# Patient Record
Sex: Female | Born: 1950 | Race: White | Hispanic: No | Smoking: Former smoker
Health system: Southern US, Community
[De-identification: ages and names within clinical notes are randomized; demographics above are authoritative.]

## PROBLEM LIST (undated history)

## (undated) DIAGNOSIS — J309 Allergic rhinitis, unspecified: Secondary | ICD-10-CM

## (undated) DIAGNOSIS — M4712 Other spondylosis with myelopathy, cervical region: Secondary | ICD-10-CM

## (undated) DIAGNOSIS — M5432 Sciatica, left side: Secondary | ICD-10-CM

## (undated) DIAGNOSIS — E785 Hyperlipidemia, unspecified: Secondary | ICD-10-CM

## (undated) DIAGNOSIS — F0390 Unspecified dementia without behavioral disturbance: Secondary | ICD-10-CM

## (undated) DIAGNOSIS — F419 Anxiety disorder, unspecified: Secondary | ICD-10-CM

## (undated) DIAGNOSIS — M21372 Foot drop, left foot: Secondary | ICD-10-CM

## (undated) DIAGNOSIS — B029 Zoster without complications: Secondary | ICD-10-CM

## (undated) DIAGNOSIS — M4800 Spinal stenosis, site unspecified: Secondary | ICD-10-CM

## (undated) DIAGNOSIS — G4733 Obstructive sleep apnea (adult) (pediatric): Secondary | ICD-10-CM

## (undated) DIAGNOSIS — M5030 Other cervical disc degeneration, unspecified cervical region: Secondary | ICD-10-CM

## (undated) DIAGNOSIS — K219 Gastro-esophageal reflux disease without esophagitis: Secondary | ICD-10-CM

## (undated) DIAGNOSIS — R7303 Prediabetes: Secondary | ICD-10-CM

## (undated) DIAGNOSIS — M810 Age-related osteoporosis without current pathological fracture: Secondary | ICD-10-CM

## (undated) DIAGNOSIS — F32A Depression, unspecified: Secondary | ICD-10-CM

## (undated) DIAGNOSIS — I10 Essential (primary) hypertension: Secondary | ICD-10-CM

## (undated) HISTORY — DX: Spinal stenosis, site unspecified: M48.00

## (undated) HISTORY — DX: Obstructive sleep apnea (adult) (pediatric): G47.33

## (undated) HISTORY — DX: Anxiety disorder, unspecified: F41.9

## (undated) HISTORY — DX: Sciatica, left side: M54.32

## (undated) HISTORY — DX: Other cervical disc degeneration, unspecified cervical region: M50.30

## (undated) HISTORY — PX: CARDIAC CATHETERIZATION: SHX172

## (undated) HISTORY — DX: Allergic rhinitis, unspecified: J30.9

## (undated) HISTORY — PX: PARTIAL HYSTERECTOMY: SHX80

## (undated) HISTORY — DX: Gastro-esophageal reflux disease without esophagitis: K21.9

## (undated) HISTORY — PX: OTHER SURGICAL HISTORY: SHX169

## (undated) HISTORY — PX: BREAST BIOPSY: SHX20

## (undated) HISTORY — PX: VEIN LIGATION AND STRIPPING: SHX2653

---

## 2015-10-30 ENCOUNTER — Encounter: Payer: Self-pay | Admitting: Internal Medicine

## 2015-10-30 ENCOUNTER — Ambulatory Visit (INDEPENDENT_AMBULATORY_CARE_PROVIDER_SITE_OTHER): Payer: Federal, State, Local not specified - PPO | Admitting: Internal Medicine

## 2015-10-30 VITALS — BP 122/76 | HR 84 | Ht 65.0 in | Wt 140.6 lb

## 2015-10-30 DIAGNOSIS — K219 Gastro-esophageal reflux disease without esophagitis: Secondary | ICD-10-CM | POA: Insufficient documentation

## 2015-10-30 DIAGNOSIS — B029 Zoster without complications: Secondary | ICD-10-CM

## 2015-10-30 DIAGNOSIS — F419 Anxiety disorder, unspecified: Secondary | ICD-10-CM

## 2015-10-30 DIAGNOSIS — M5417 Radiculopathy, lumbosacral region: Secondary | ICD-10-CM

## 2015-10-30 DIAGNOSIS — M5412 Radiculopathy, cervical region: Secondary | ICD-10-CM | POA: Diagnosis not present

## 2015-10-30 DIAGNOSIS — M5416 Radiculopathy, lumbar region: Secondary | ICD-10-CM

## 2015-10-30 DIAGNOSIS — F32A Depression, unspecified: Secondary | ICD-10-CM | POA: Insufficient documentation

## 2015-10-30 MED ORDER — ESCITALOPRAM OXALATE 5 MG PO TABS: 15.0000 mg | ORAL_TABLET | Freq: Every day | ORAL | Status: DC

## 2015-10-30 MED ORDER — ACYCLOVIR 400 MG PO TABS: 400.0000 mg | ORAL_TABLET | Freq: Two times a day (BID) | ORAL | Status: DC

## 2015-10-30 MED ORDER — OMEPRAZOLE 20 MG PO CPDR: 20.0000 mg | DELAYED_RELEASE_CAPSULE | Freq: Every day | ORAL | Status: DC

## 2015-10-30 NOTE — Progress Notes (Signed)
Date:  10/30/2015   Name:  Sarah Donovan   DOB:  02-11-1951   MRN:  [id number]   Chief Complaint: New Evaluation Patient recently moved here from [place], [place] to be closer to family in [place] - started about 6 years ago when husband passed away.  Started on Lexpro and recently increased dose to 15 mg per day. She thinks it is helping.  GERD - on PPI for years. EGD done 4 years ago showed some inflammation.  She has good control of symptoms.  Shingles - started in 2015 on right buttock.  She has had 3 breakouts since then.  She now takes daily suppresson.  She has also a hx of fever blisters. She does not know if the blisters were ever cultured to determine if it is Varicella or Herpes.  She is under the impression that the Zostavax caused her to have this.  Lumbar Disc disease - found on MRI - mild and not causing symptoms.  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  She has tried PTx and ESI injections without benefit.  Neurosurgeon does not believe that she would benefit from surgery.  She has ongoing right sided sciatica and right arm tingling and intermittent antecubital pain.  Her last CPX was in December.  She had a normal mammogram and Pelvic/pap at that time.  She had a colonoscopy about 10 yrs ago.  It was normal but very painful.  She would consider a 10 yr follow up.  Review of Systems  Constitutional: Negative for fever, chills, appetite change and fatigue.  Respiratory: Negative for cough, chest tightness, shortness of breath and wheezing.   Cardiovascular: Negative for chest pain, palpitations and leg swelling.  Gastrointestinal: Negative for abdominal pain and blood in stool.  Genitourinary: Positive for dyspareunia (improved with osphena). Negative for hematuria and pelvic pain.  Musculoskeletal: Positive for back pain (and right sided sciatica), joint swelling (intermittent in hands) and neck stiffness. Negative for gait problem.  Skin: Negative for rash and  wound.  Neurological: Positive for numbness. Negative for dizziness, tremors, weakness, light-headedness and headaches.  Hematological: Negative for adenopathy. Does not bruise/bleed easily.  Psychiatric/Behavioral: Negative for hallucinations, confusion and sleep disturbance. The patient is nervous/anxious.     There are no active problems to display for this patient.   Prior to Admission medications   Medication Sig Start Date End Date Taking? Authorizing Provider  acyclovir (ZOVIRAX) 400 MG tablet Take 1 tablet by mouth 2 (two) times daily. 09/19/15  Yes Historical Provider, MD  escitalopram (LEXAPRO) 5 MG tablet Take 1 tablet by mouth daily. 09/23/15  Yes Historical Provider, MD  fluticasone Asencion Islam) 50 MCG/ACT nasal spray  08/14/15  Yes Historical Provider, MD  omeprazole (PRILOSEC) 20 MG capsule Take 20 mg by mouth daily. 07/24/15  Yes Historical Provider, MD    Allergies  Allergen Reactions  . Penicillins     Past Surgical History  Procedure Laterality Date  . Partial hysterectomy      ovaries removed  . Vein ligation and stripping Left     left leg  . Breast biopsy      Social History  Substance Use Topics  . Smoking status: Former Smoker    Quit date: 10/30/1995  . Smokeless tobacco: None  . Alcohol Use: 0.0 oz/week    0 Standard drinks or equivalent per week     Comment: couple glasses of wine/ 4 beers every few days    Medication list has been  reviewed and updated.  Physical Exam  Constitutional: She is oriented to person, place, and time. She appears well-developed. No distress.  HENT:  Head: Normocephalic and atraumatic.  Neck: Muscular tenderness present. Carotid bruit is not present. No thyroid mass present.  Cardiovascular: Normal rate, regular rhythm and normal heart sounds.   Pulmonary/Chest: Effort normal and breath sounds normal. No respiratory distress. She has no rales.  Musculoskeletal: Normal range of motion.       Right elbow: She exhibits  normal range of motion and no swelling. Tenderness found.       Lumbar back: She exhibits normal range of motion, no tenderness, no bony tenderness and no spasm.  Lymphadenopathy:    She has no cervical adenopathy.  Neurological: She is alert and oriented to person, place, and time. She has normal reflexes.  Skin: Skin is warm and dry. No rash noted.  Psychiatric: She has a normal mood and affect. Her behavior is normal. Thought content normal.  Nursing note and vitals reviewed.   BP 122/76 mmHg  Pulse 84  Ht 5\' 5"  (1.651 m)  Wt 140 lb 9.6 oz (63.776 kg)  BMI 23.40 kg/m2  Assessment and Plan: 1. Anxiety disorder, unspecified Improved - continue current therapy - escitalopram (LEXAPRO) 5 MG tablet; Take 3 tablets (15 mg total) by mouth daily.  Dispense: 90 tablet; Refill: 5  2. GERD without esophagitis controlled - omeprazole (PRILOSEC) 20 MG capsule; Take 1 capsule (20 mg total) by mouth daily.  Dispense: 30 capsule; Refill: 5  3. Herpes zoster Could also be cutaneous herpes simplex - she will try to find out if her previous PCP did a culture Continue daily suppressive therapy - acyclovir (ZOVIRAX) 400 MG tablet; Take 1 tablet (400 mg total) by mouth 2 (two) times daily.  Dispense: 60 tablet; Refill: 5  4. Chronic cervical radiculopathy Unexplained right antecubital discomfort - likely neuropathic Pt will get MRI reports to review for consideration of further evaluation  5. Lumbar back pain with radiculopathy affecting right lower extremity Pt will get me a copy of MRI to review   Halina Maidens, MD [facility]  10/30/2015

## 2015-12-09 ENCOUNTER — Ambulatory Visit (INDEPENDENT_AMBULATORY_CARE_PROVIDER_SITE_OTHER): Payer: Federal, State, Local not specified - PPO | Admitting: Internal Medicine

## 2015-12-09 ENCOUNTER — Encounter: Payer: Self-pay | Admitting: Internal Medicine

## 2015-12-09 ENCOUNTER — Ambulatory Visit
Admission: RE | Admit: 2015-12-09 | Discharge: 2015-12-09 | Disposition: A | Discharge status: Home / Self Care | Payer: Federal, State, Local not specified - PPO | Source: Ambulatory Visit | Attending: Internal Medicine | Admitting: Internal Medicine

## 2015-12-09 VITALS — BP 132/80 | HR 84 | Temp 98.0°F | Resp 16 | Ht 65.0 in | Wt 139.0 lb

## 2015-12-09 DIAGNOSIS — M25552 Pain in left hip: Secondary | ICD-10-CM

## 2015-12-09 MED ORDER — METHYLPREDNISOLONE 4 MG PO TBPK: ORAL_TABLET | ORAL | Status: DC

## 2015-12-09 NOTE — Progress Notes (Signed)
Date:  12/09/2015   Name:  Sarah Donovan   DOB:  Apr 15, 1951   MRN:  [id number]   Chief Complaint: Hip Pain Hip Pain  There was no injury mechanism. The pain is present in the left hip. The quality of the pain is described as aching. The pain is moderate. Associated symptoms include an inability to bear weight. She has tried heat, ice and NSAIDs for the symptoms. The treatment provided mild relief.  Pain is above the anterior left hip and lateral pubic rami. When sitting there is very little pain.  She can not lay on that side.  Getting into and out of the car is very painful. She has a hx of sciatica on the right - this is not the same type of discomfort.  There is no radiation of pain into the buttocks or down the leg.   Review of Systems  Constitutional: Negative for fever, chills and fatigue.  Respiratory: Negative for chest tightness and shortness of breath.   Cardiovascular: Negative for chest pain, palpitations and leg swelling.  Gastrointestinal: Negative for nausea, abdominal pain and constipation.  Genitourinary: Negative for difficulty urinating.  Musculoskeletal: Positive for myalgias, arthralgias and gait problem. Negative for joint swelling.    Patient Active Problem List   Diagnosis Date Noted  . Anxiety disorder, unspecified 10/30/2015  . GERD without esophagitis 10/30/2015  . Herpes zoster 10/30/2015  . Chronic cervical radiculopathy 10/30/2015  . Lumbar back pain with radiculopathy affecting right lower extremity 10/30/2015    Prior to Admission medications   Medication Sig Start Date End Date Taking? Authorizing Provider  acyclovir (ZOVIRAX) 400 MG tablet Take 1 tablet (400 mg total) by mouth 2 (two) times daily. 10/30/15  Yes Glean Hess, MD  escitalopram (LEXAPRO) 5 MG tablet Take 3 tablets (15 mg total) by mouth daily. 10/30/15  Yes Glean Hess, MD  fluticasone Asencion Islam) 50 MCG/ACT nasal spray  08/14/15  Yes Historical Provider, MD  omeprazole (PRILOSEC)  20 MG capsule Take 1 capsule (20 mg total) by mouth daily. 10/30/15  Yes Glean Hess, MD  Ospemifene (OSPHENA) 60 MG TABS Take 1 tablet by mouth daily.   Yes Historical Provider, MD    Allergies  Allergen Reactions  . Gabapentin Shortness Of Breath  . Penicillins     Past Surgical History  Procedure Laterality Date  . Partial hysterectomy      ovaries removed  . Vein ligation and stripping Left     left leg  . Breast biopsy      Social History  Substance Use Topics  . Smoking status: Former Smoker    Quit date: 10/30/1995  . Smokeless tobacco: None  . Alcohol Use: 0.0 oz/week    0 Standard drinks or equivalent per week     Comment: couple glasses of wine/ 4 beers every few days    Medication list has been reviewed and updated.  Physical Exam  Constitutional: She is oriented to person, place, and time. She appears well-developed and well-nourished. She appears distressed (moderately uncomfortable).  Neck: Normal range of motion. Neck supple.  Cardiovascular: Normal rate, regular rhythm and normal heart sounds.   Pulmonary/Chest: Effort normal and breath sounds normal.  Abdominal: Soft. Normal appearance and bowel sounds are normal.  Musculoskeletal:       Right hip: She exhibits normal range of motion, no tenderness and no bony tenderness.       Left hip: She exhibits normal range of motion and no bony  tenderness.       Lumbar back: She exhibits no tenderness, no swelling and no spasm.  Tender of the anterior iliac crest above the hip. No tenderness of lateral hip bursa No pain with ROM of left hip   Neurological: She is alert and oriented to person, place, and time.  Reflex Scores:      Patellar reflexes are 2+ on the right side and 2+ on the left side. Nursing note and vitals reviewed.   BP 132/80 mmHg  Pulse 84  Temp(Src) 98 F (36.7 C) (Oral)  Resp 16  Ht 5\' 5"  (1.651 m)  Wt 139 lb (63.05 kg)  BMI 23.13 kg/m2  SpO2 96%  Assessment and Plan: 1. Hip  pain, acute, left Suspect muscle-ligament strain If xray abnormal and/or pain persistent, will refer to Orthopedics - methylPREDNISolone (MEDROL DOSEPAK) 4 MG TBPK tablet; Take 6 pills on day 1 the 5 pills day 2 then 4 pills day 3 then 3 pills day 4 then 2 pills day 5 then one pills day 6 then stop  Dispense: 21 tablet; Refill: 0 - DG HIP UNILAT WITH PELVIS 2-3 VIEWS LEFT; Future   Halina Maidens, MD [facility]  12/09/2015

## 2016-01-30 ENCOUNTER — Ambulatory Visit: Payer: Federal, State, Local not specified - PPO | Admitting: Internal Medicine

## 2016-03-03 ENCOUNTER — Ambulatory Visit (INDEPENDENT_AMBULATORY_CARE_PROVIDER_SITE_OTHER): Payer: Federal, State, Local not specified - PPO | Admitting: Family Medicine

## 2016-03-03 ENCOUNTER — Encounter: Payer: Self-pay | Admitting: Family Medicine

## 2016-03-03 VITALS — BP 120/84 | HR 60 | Ht 65.0 in | Wt 135.0 lb

## 2016-03-03 DIAGNOSIS — N3001 Acute cystitis with hematuria: Secondary | ICD-10-CM

## 2016-03-03 LAB — POCT URINALYSIS DIPSTICK
Bilirubin, UA: NEGATIVE
Glucose, UA: NEGATIVE
Ketones, UA: NEGATIVE
Nitrite, UA: NEGATIVE
Protein, UA: NEGATIVE
Spec Grav, UA: 1.01
Urobilinogen, UA: 0.2
pH, UA: 6

## 2016-03-03 MED ORDER — AZITHROMYCIN 250 MG PO TABS: ORAL_TABLET | ORAL | 0 refills | Status: DC

## 2016-03-03 MED ORDER — CIPROFLOXACIN HCL 250 MG PO TABS: 250.0000 mg | ORAL_TABLET | Freq: Two times a day (BID) | ORAL | 0 refills | Status: DC

## 2016-03-03 NOTE — Progress Notes (Signed)
Name: Sarah Donovan   MRN: [id number]    DOB: [birth year]   Date:03/03/2016       Progress Note  Subjective  Chief Complaint  Chief Complaint  Patient presents with  . Urinary Tract Infection    pressure at end of stream- hx of UTIs- [employer] works for her    Urinary Tract Infection   This is a new problem. The current episode started in the past 7 days. The problem occurs every urination. The problem has been gradually worsening. The quality of the pain is described as burning. The pain is mild. There has been no fever. Associated symptoms include frequency and urgency. Pertinent negatives include no chills, discharge, flank pain, hematuria, sweats or vomiting. She has tried increased fluids and home medications for the symptoms. The treatment provided no relief. Her past medical history is significant for kidney stones. There is no history of recurrent UTIs.    No problem-specific Assessment & Plan notes found for this encounter.   Past Medical History:  Diagnosis Date  . Allergic rhinitis   . Anxiety   . Degenerative cervical disc   . GERD (gastroesophageal reflux disease)   . Obstructive sleep apnea    Currently on CPAP  . Spinal stenosis     Past Surgical History:  Procedure Laterality Date  . BREAST BIOPSY    . PARTIAL HYSTERECTOMY     ovaries removed  . VEIN LIGATION AND STRIPPING Left    left leg    Family History  Problem Relation Age of Onset  . Lung cancer Mother   . Heart failure Father     Social History   Social History  . Marital status: Unknown    Spouse name: N/A  . Number of children: N/A  . Years of education: N/A   Occupational History  . Not on file.   Social History Main Topics  . Smoking status: Former Smoker    Quit date: 10/30/1995  . Smokeless tobacco: Never Used  . Alcohol use 0.0 oz/week     Comment: couple glasses of wine/ 4 beers every few days  . Drug use: No  . Sexual activity: Not Currently   Other Topics Concern  . Not on file    Social History Narrative  . No narrative on file    Allergies  Allergen Reactions  . Gabapentin Shortness Of Breath  . Penicillins      Review of Systems  Constitutional: Negative for chills.  Gastrointestinal: Negative for vomiting.  Genitourinary: Positive for frequency and urgency. Negative for flank pain and hematuria.     Objective  Vitals:   03/03/16 0947  BP: 120/84  Pulse: 60  Weight: 135 lb (61.2 kg)  Height: 5\' 5"  (1.651 m)    Physical Exam  Constitutional: She is well-developed, well-nourished, and in no distress. No distress.  HENT:  Head: Normocephalic and atraumatic.  Right Ear: External ear normal.  Left Ear: External ear normal.  Nose: Nose normal.  Mouth/Throat: Oropharynx is clear and moist.  Eyes: Conjunctivae and EOM are normal. Pupils are equal, round, and reactive to light. Right eye exhibits no discharge. Left eye exhibits no discharge.  Neck: Normal range of motion. Neck supple. No JVD present. No thyromegaly present.  Cardiovascular: Normal rate, regular rhythm, normal heart sounds and intact distal pulses.  Exam reveals no gallop and no friction rub.   No murmur heard. Pulmonary/Chest: Effort normal and breath sounds normal.  Abdominal: Soft. Bowel sounds are normal. She exhibits no  mass. There is no hepatosplenomegaly. There is tenderness in the suprapubic area. There is no guarding and no CVA tenderness.  Musculoskeletal: Normal range of motion. She exhibits no edema.  Lymphadenopathy:    She has no cervical adenopathy.  Neurological: She is alert. She has normal reflexes.  Skin: Skin is warm and dry. She is not diaphoretic.  Psychiatric: Mood and affect normal.  Nursing note and vitals reviewed.     Assessment & Plan  Problem List Items Addressed This Visit    None    Visit Diagnoses    Acute cystitis with hematuria    -  Primary   Relevant Medications   ciprofloxacin (CIPRO) 250 MG tablet   Other Relevant Orders   POCT  Urinalysis Dipstick (Completed)        Dr. [facility]  03/03/16

## 2016-03-16 ENCOUNTER — Ambulatory Visit (INDEPENDENT_AMBULATORY_CARE_PROVIDER_SITE_OTHER): Payer: Federal, State, Local not specified - PPO | Admitting: Internal Medicine

## 2016-03-16 ENCOUNTER — Encounter: Payer: Self-pay | Admitting: Internal Medicine

## 2016-03-16 VITALS — BP 121/80 | HR 59 | Resp 16 | Ht 65.0 in | Wt 135.6 lb

## 2016-03-16 DIAGNOSIS — M5412 Radiculopathy, cervical region: Secondary | ICD-10-CM | POA: Diagnosis not present

## 2016-03-16 DIAGNOSIS — M6530 Trigger finger, unspecified finger: Secondary | ICD-10-CM | POA: Diagnosis not present

## 2016-03-16 DIAGNOSIS — E785 Hyperlipidemia, unspecified: Secondary | ICD-10-CM | POA: Diagnosis not present

## 2016-03-16 DIAGNOSIS — E782 Mixed hyperlipidemia: Secondary | ICD-10-CM | POA: Insufficient documentation

## 2016-03-16 DIAGNOSIS — G4730 Sleep apnea, unspecified: Secondary | ICD-10-CM | POA: Insufficient documentation

## 2016-03-16 DIAGNOSIS — G4733 Obstructive sleep apnea (adult) (pediatric): Secondary | ICD-10-CM | POA: Diagnosis not present

## 2016-03-16 DIAGNOSIS — Z9989 Dependence on other enabling machines and devices: Secondary | ICD-10-CM

## 2016-03-16 NOTE — Patient Instructions (Signed)
Health Maintenance  Topic Date Due  . Hepatitis C Screening  10/08/1950  . HIV Screening  12/25/1965  . TETANUS/TDAP  12/25/1969  . DEXA SCAN  12/26/2015  . PNA vac Low Risk Adult (1 of 2 - PCV13) 12/26/2015  . COLONOSCOPY  01/16/2016  . INFLUENZA VACCINE  03/03/2016  . MAMMOGRAM  09/01/2016  . PAP SMEAR  07/17/2018  . ZOSTAVAX  Addressed

## 2016-03-16 NOTE — Progress Notes (Signed)
Date:  03/16/2016   Name:  Sarah Donovan   DOB:  07/30/1951   MRN:  [id number]   Chief Complaint: Hyperlipidemia and Hand Pain (Right hand feels tight and like a baloon  and when making fist the 3 digit does not open back up. ) OSA -  On CPAP nightly, sleeps fairly well.  Very consistent in using the device.  She is able to download data to her phone for monitoring.   Hyperlipidemia  This is a chronic problem. The current episode started more than 1 year ago. Recent lipid tests were reviewed and are high. Pertinent negatives include no chest pain or shortness of breath. Treatments tried: previously took medications. The current treatment provides significant improvement of lipids.  Hand Pain   There was no injury mechanism. The pain does not radiate. The pain is mild. Pertinent negatives include no chest pain. Associated symptoms comments: Triggering of middle finger for the first time this AM. She has tried NSAIDs for the symptoms. The treatment provided moderate relief.  Neck Pain   This is a chronic problem. The problem occurs daily. The problem has been unchanged. The quality of the pain is described as aching and burning. Pertinent negatives include no chest pain.    Review of Systems  Constitutional: Negative for activity change, appetite change and unexpected weight change.  Eyes: Negative for visual disturbance.  Respiratory: Negative for cough, chest tightness, shortness of breath and wheezing.   Cardiovascular: Negative for chest pain, palpitations and leg swelling.  Gastrointestinal: Negative for abdominal pain.  Musculoskeletal: Positive for arthralgias, joint swelling and neck pain.  Skin: Negative for color change and rash.  Hematological: Negative for adenopathy.  Psychiatric/Behavioral: Positive for sleep disturbance. Negative for dysphoric mood.    Patient Active Problem List   Diagnosis Date Noted  . Hyperlipidemia 03/16/2016  . Trigger finger, acquired 03/16/2016    . Anxiety disorder, unspecified 10/30/2015  . GERD without esophagitis 10/30/2015  . Herpes zoster 10/30/2015  . Chronic cervical radiculopathy 10/30/2015  . Lumbar back pain with radiculopathy affecting right lower extremity 10/30/2015    Prior to Admission medications   Medication Sig Start Date End Date Taking? Authorizing Provider  acyclovir (ZOVIRAX) 400 MG tablet Take 1 tablet (400 mg total) by mouth 2 (two) times daily. 10/30/15  Yes Glean Hess, MD  escitalopram (LEXAPRO) 5 MG tablet Take 3 tablets (15 mg total) by mouth daily. 10/30/15  Yes Glean Hess, MD  omeprazole (PRILOSEC) 20 MG capsule Take 1 capsule (20 mg total) by mouth daily. 10/30/15  Yes Glean Hess, MD  Ospemifene (OSPHENA) 60 MG TABS Take 1 tablet by mouth daily.   Yes Historical Provider, MD    Allergies  Allergen Reactions  . Gabapentin Shortness Of Breath  . Penicillins     Past Surgical History:  Procedure Laterality Date  . BREAST BIOPSY    . PARTIAL HYSTERECTOMY     ovaries removed  . VEIN LIGATION AND STRIPPING Left    left leg    Social History  Substance Use Topics  . Smoking status: Former Smoker    Quit date: 10/30/1995  . Smokeless tobacco: Never Used  . Alcohol use 0.0 oz/week     Comment: couple glasses of wine/ 4 beers every few days     Medication list has been reviewed and updated.   Physical Exam  Constitutional: She is oriented to person, place, and time. She appears well-developed. No distress.  HENT:  Head:  Normocephalic and atraumatic.  Cardiovascular: Normal rate and regular rhythm.   Pulmonary/Chest: Effort normal and breath sounds normal. No respiratory distress.  Musculoskeletal: Normal range of motion.  Tenderness of MCP joints both hands without synovitis Grip decreased due to discomfort No joint deformity noted  Neurological: She is alert and oriented to person, place, and time. She has normal reflexes.  Skin: Skin is warm and dry. No rash noted.   Psychiatric: She has a normal mood and affect. Her behavior is normal. Thought content normal.  Nursing note and vitals reviewed.   BP 121/80 (BP Location: Left Arm, Patient Position: Sitting, Cuff Size: Normal)   Pulse (!) 59   Resp 16   Ht 5\' 5"  (1.651 m)   Wt 135 lb 9.6 oz (61.5 kg)   SpO2 97%   BMI 22.57 kg/m   Assessment and Plan: 1. Hyperlipidemia Will advise on medication if needed - Comprehensive metabolic panel - Lipid panel  2. Trigger finger, acquired Continue Aleve for hand discomfort and neck pain - TSH - Rheumatoid factor  3. OSA on CPAP Doing well  4. Chronic cervical radiculopathy Stable; continue Aleve bid   Halina Maidens, MD [facility]  03/16/2016

## 2016-03-17 LAB — COMPREHENSIVE METABOLIC PANEL
ALBUMIN: 4 g/dL (ref 3.6–4.8)
ALK PHOS: 59 IU/L (ref 39–117)
ALT: 18 IU/L (ref 0–32)
AST: 24 IU/L (ref 0–40)
Albumin/Globulin Ratio: 1.3 (ref 1.2–2.2)
BILIRUBIN TOTAL: 0.5 mg/dL (ref 0.0–1.2)
BUN/Creatinine Ratio: 18 (ref 12–28)
BUN: 15 mg/dL (ref 8–27)
CHLORIDE: 102 mmol/L (ref 96–106)
CO2: 24 mmol/L (ref 18–29)
CREATININE: 0.83 mg/dL (ref 0.57–1.00)
Calcium: 9 mg/dL (ref 8.7–10.3)
GFR calc non Af Amer: 74 mL/min/{1.73_m2} (ref >59)
GFR, EST AFRICAN AMERICAN: 86 mL/min/{1.73_m2} (ref >59)
GLOBULIN, TOTAL: 3 g/dL (ref 1.5–4.5)
Glucose: 80 mg/dL (ref 65–99)
Potassium: 4.5 mmol/L (ref 3.5–5.2)
Sodium: 139 mmol/L (ref 134–144)
TOTAL PROTEIN: 7 g/dL (ref 6.0–8.5)

## 2016-03-17 LAB — LIPID PANEL
Chol/HDL Ratio: 3.6 ratio units (ref 0.0–4.4)
Cholesterol, Total: 265 mg/dL — ABNORMAL HIGH (ref 100–199)
HDL: 73 mg/dL (ref >39)
LDL CALC: 168 mg/dL — AB (ref 0–99)
Triglycerides: 119 mg/dL (ref 0–149)
VLDL CHOLESTEROL CAL: 24 mg/dL (ref 5–40)

## 2016-03-17 LAB — TSH: TSH: 3.35 u[IU]/mL (ref 0.450–4.500)

## 2016-03-17 LAB — RHEUMATOID FACTOR: Rhuematoid fact SerPl-aCnc: 14.6 IU/mL — ABNORMAL HIGH (ref 0.0–13.9)

## 2016-03-19 LAB — CYCLIC CITRUL PEPTIDE ANTIBODY, IGG/IGA: CYCLIC CITRULLIN PEPTIDE AB: 4 U (ref 0–19)

## 2016-03-19 LAB — SPECIMEN STATUS REPORT

## 2016-04-07 ENCOUNTER — Ambulatory Visit (INDEPENDENT_AMBULATORY_CARE_PROVIDER_SITE_OTHER): Payer: Federal, State, Local not specified - PPO | Admitting: Internal Medicine

## 2016-04-07 ENCOUNTER — Encounter: Payer: Self-pay | Admitting: Internal Medicine

## 2016-04-07 VITALS — BP 124/82 | HR 61 | Resp 16 | Ht 65.0 in | Wt 133.0 lb

## 2016-04-07 DIAGNOSIS — M5417 Radiculopathy, lumbosacral region: Secondary | ICD-10-CM

## 2016-04-07 DIAGNOSIS — M5416 Radiculopathy, lumbar region: Secondary | ICD-10-CM

## 2016-04-07 MED ORDER — CYCLOBENZAPRINE HCL 10 MG PO TABS: 10.0000 mg | ORAL_TABLET | Freq: Every day | ORAL | 0 refills | Status: DC

## 2016-04-07 MED ORDER — METHYLPREDNISOLONE 4 MG PO TBPK: ORAL_TABLET | ORAL | 0 refills | Status: DC

## 2016-04-07 NOTE — Progress Notes (Signed)
Date:  04/07/2016   Name:  Sarah Donovan   DOB:  [birth year]   MRN:  [id number]   Chief Complaint: Hip Pain (Right Hip pain started 12 noon unday Denies present or past injury. Feels Bone or tenson pop. Patient walked to room in hunch like position. She can not stand upright. She has pain from bottom of foot radiating to hip at times. Sitting is only time that happens. Standing causes pain ONLY in hip. ) Patient has a history of piriformis muscle syndrome as well as lumbar back pain with radiculopathy. This pain started suddenly in her posterior right hip making it very painful her to stand straight and bear weight. Sitting is more comfortable. She can lie down if her knees are bent. She's been taking Aleve twice a day without much benefit. She's never established with orthopedics in this area. Several months ago she had similar pain in the left hip. X-rays of the hip were normal. She responded well to prednisone taper.   Review of Systems  Constitutional: Negative for chills, fatigue and fever.  Respiratory: Negative for chest tightness and shortness of breath.   Cardiovascular: Negative for chest pain.  Gastrointestinal: Negative for abdominal pain.  Musculoskeletal: Positive for arthralgias, back pain and gait problem. Negative for joint swelling and myalgias.  Neurological: Positive for numbness (radiating hip to toes). Negative for dizziness, weakness and headaches.    Patient Active Problem List   Diagnosis Date Noted  . Hyperlipidemia 03/16/2016  . Trigger finger, acquired 03/16/2016  . OSA on CPAP 03/16/2016  . Anxiety disorder, unspecified 10/30/2015  . GERD without esophagitis 10/30/2015  . Herpes zoster 10/30/2015  . Chronic cervical radiculopathy 10/30/2015  . Lumbar back pain with radiculopathy affecting right lower extremity 10/30/2015    Prior to Admission medications   Medication Sig Start Date End Date Taking? Authorizing Provider  acyclovir (ZOVIRAX) 400 MG tablet  Take 1 tablet (400 mg total) by mouth 2 (two) times daily. 10/30/15  Yes Glean Hess, MD  escitalopram (LEXAPRO) 5 MG tablet Take 3 tablets (15 mg total) by mouth daily. 10/30/15  Yes Glean Hess, MD  naproxen sodium (ANAPROX) 220 MG tablet Take 220 mg by mouth 2 (two) times daily with a meal.   Yes Historical Provider, MD  omeprazole (PRILOSEC) 20 MG capsule Take 1 capsule (20 mg total) by mouth daily. 10/30/15  Yes Glean Hess, MD  Ospemifene (OSPHENA) 60 MG TABS Take 1 tablet by mouth daily.   Yes Historical Provider, MD    Allergies  Allergen Reactions  . Gabapentin Shortness Of Breath  . Penicillins     Past Surgical History:  Procedure Laterality Date  . BREAST BIOPSY    . PARTIAL HYSTERECTOMY     ovaries removed  . VEIN LIGATION AND STRIPPING Left    left leg    Social History  Substance Use Topics  . Smoking status: Former Smoker    Quit date: 10/30/1995  . Smokeless tobacco: Never Used  . Alcohol use 0.0 oz/week     Comment: couple glasses of wine/ 4 beers every few days     Medication list has been reviewed and updated.   Physical Exam  Constitutional: She is oriented to person, place, and time. She appears well-developed. She appears distressed (secondary to pain).  Cardiovascular: Normal rate, regular rhythm and normal heart sounds.   Pulmonary/Chest: Effort normal and breath sounds normal.  Musculoskeletal:       Right hip: She exhibits  tenderness. She exhibits normal range of motion.       Left hip: Normal.       Lumbar back: She exhibits decreased range of motion and tenderness. She exhibits no spasm.  Neurological: She is alert and oriented to person, place, and time.  Skin: Skin is warm.  Psychiatric: She has a normal mood and affect.    BP 124/82 (BP Location: Left Arm, Patient Position: Sitting, Cuff Size: Normal)   Pulse 61   Resp 16   Ht 5\' 5"  (1.651 m)   Wt 133 lb (60.3 kg)   SpO2 98%   BMI 22.13 kg/m   Assessment and Plan: 1.  Lumbar back pain with radiculopathy affecting right lower extremity Suspect recurrence rather than primary hip pathology - methylPREDNISolone (MEDROL DOSEPAK) 4 MG TBPK tablet; Take 6 pills on day 1 the 5 pills day 2 then 4 pills day 3 then 3 pills day 4 then 2 pills day 5 then one pills day 6 then stop  Dispense: 21 tablet; Refill: 0 - cyclobenzaprine (FLEXERIL) 10 MG tablet; Take 1 tablet (10 mg total) by mouth at bedtime.  Dispense: 30 tablet; Refill: 0 - Ambulatory referral to [facility], [facility]  04/07/2016

## 2016-04-08 ENCOUNTER — Other Ambulatory Visit: Payer: Self-pay | Admitting: Internal Medicine

## 2016-04-08 MED ORDER — TRAMADOL HCL 50 MG PO TABS: 50.0000 mg | ORAL_TABLET | Freq: Four times a day (QID) | ORAL | 0 refills | Status: DC | PRN

## 2016-04-27 ENCOUNTER — Encounter: Payer: Self-pay | Admitting: Internal Medicine

## 2016-05-01 ENCOUNTER — Other Ambulatory Visit: Payer: Self-pay | Admitting: Internal Medicine

## 2016-05-01 DIAGNOSIS — K219 Gastro-esophageal reflux disease without esophagitis: Secondary | ICD-10-CM

## 2016-05-27 ENCOUNTER — Other Ambulatory Visit: Payer: Self-pay | Admitting: Internal Medicine

## 2016-05-27 DIAGNOSIS — K219 Gastro-esophageal reflux disease without esophagitis: Secondary | ICD-10-CM

## 2016-06-14 IMAGING — CR DG HIP (WITH OR WITHOUT PELVIS) 2-3V*L*
3 series · 3 of 3 positions shown · non-contrast
Comparison: None.

CLINICAL DATA: Left hip pain for 10 days. No known injury. Initial
encounter.

EXAM:
DG HIP (WITH OR WITHOUT PELVIS) 2-3V LEFT

[pelvis ap]
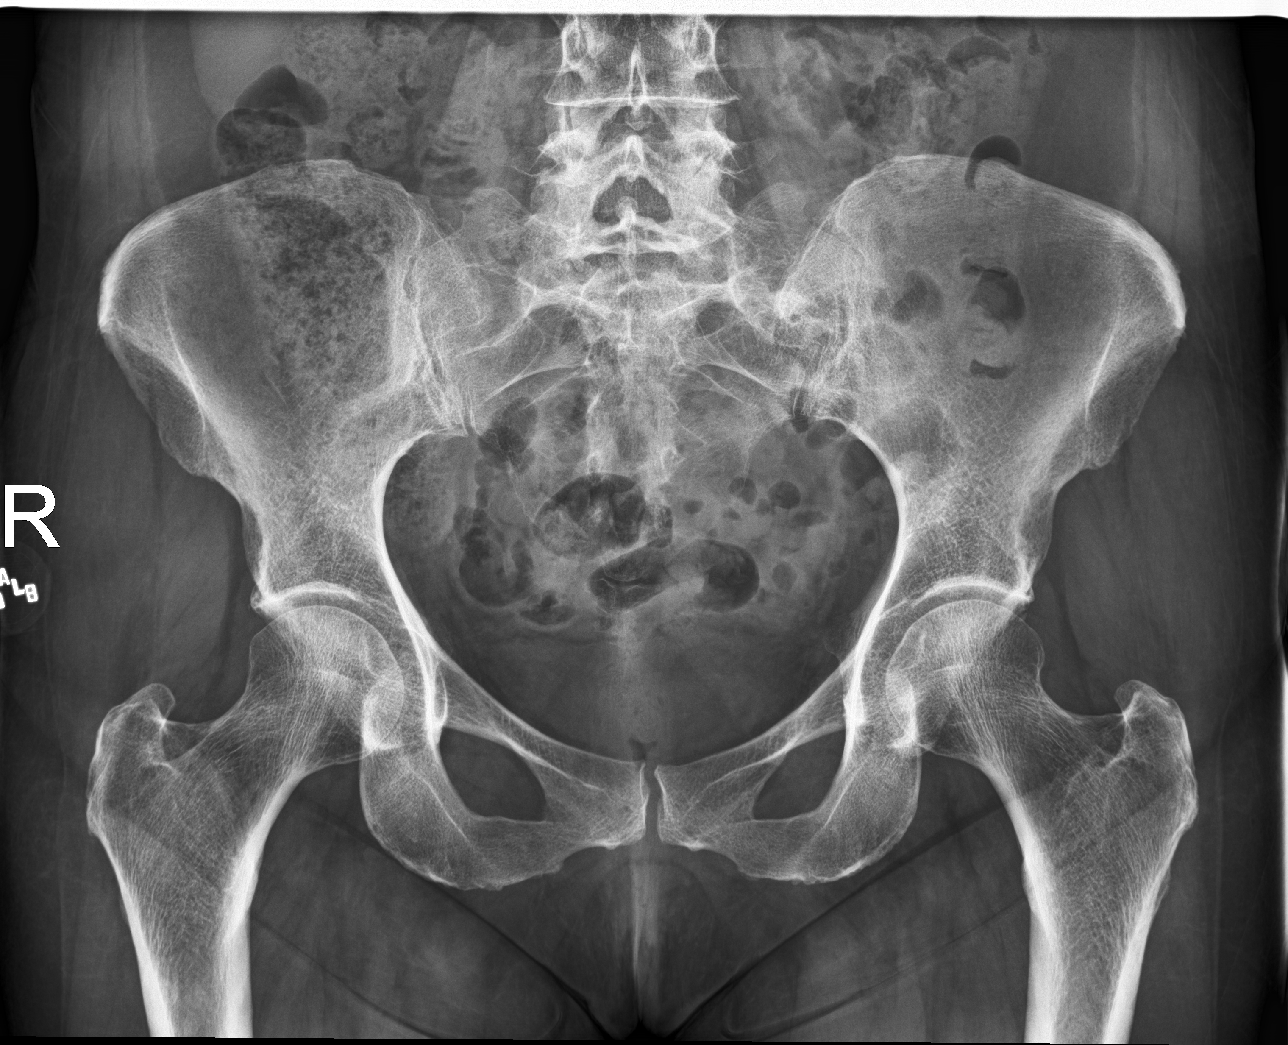

[hip ap]
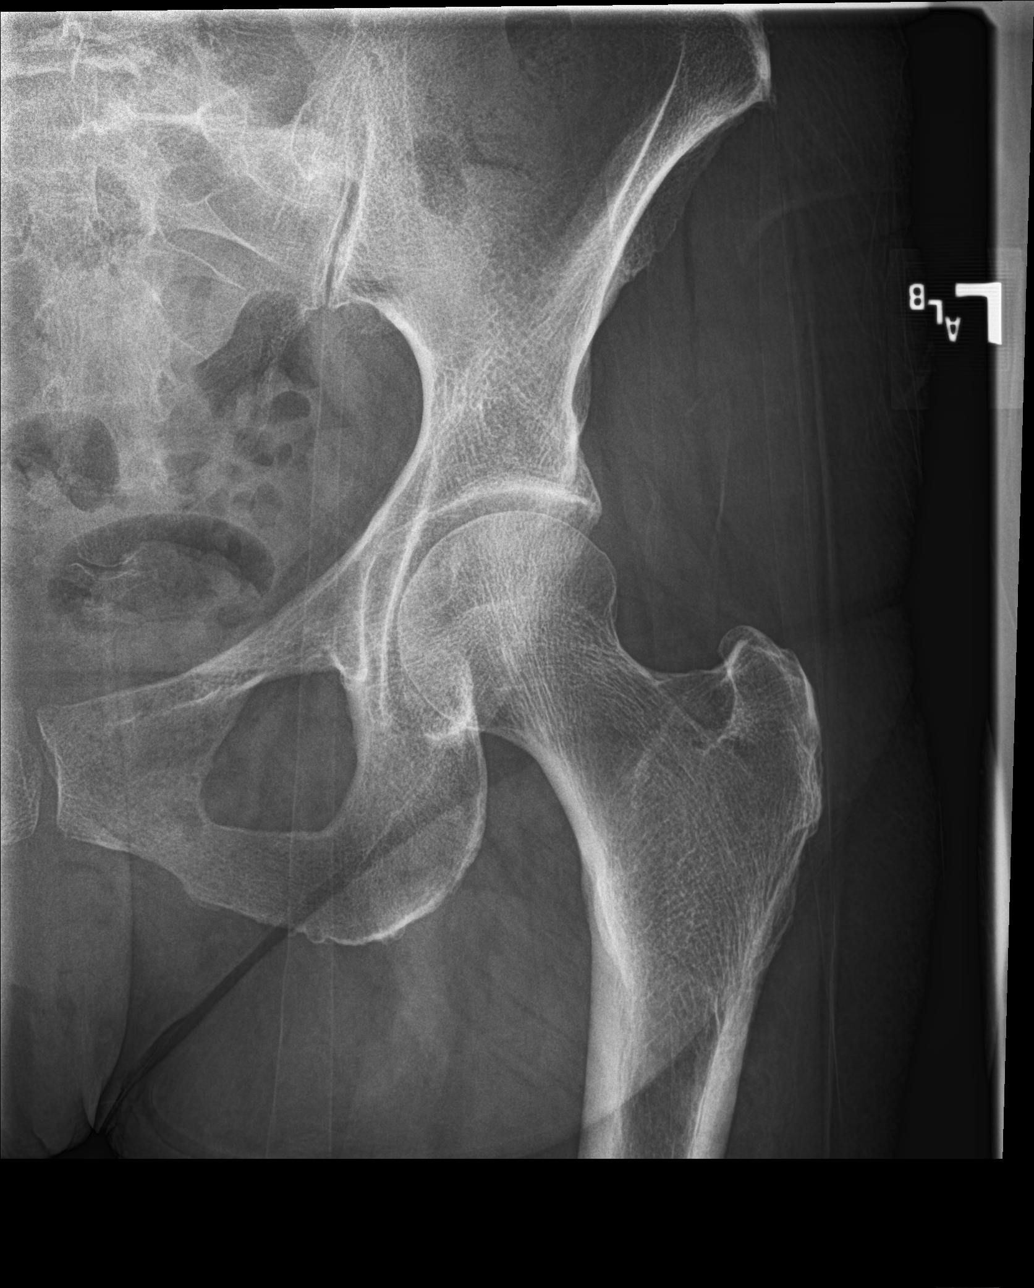

[hip lat]
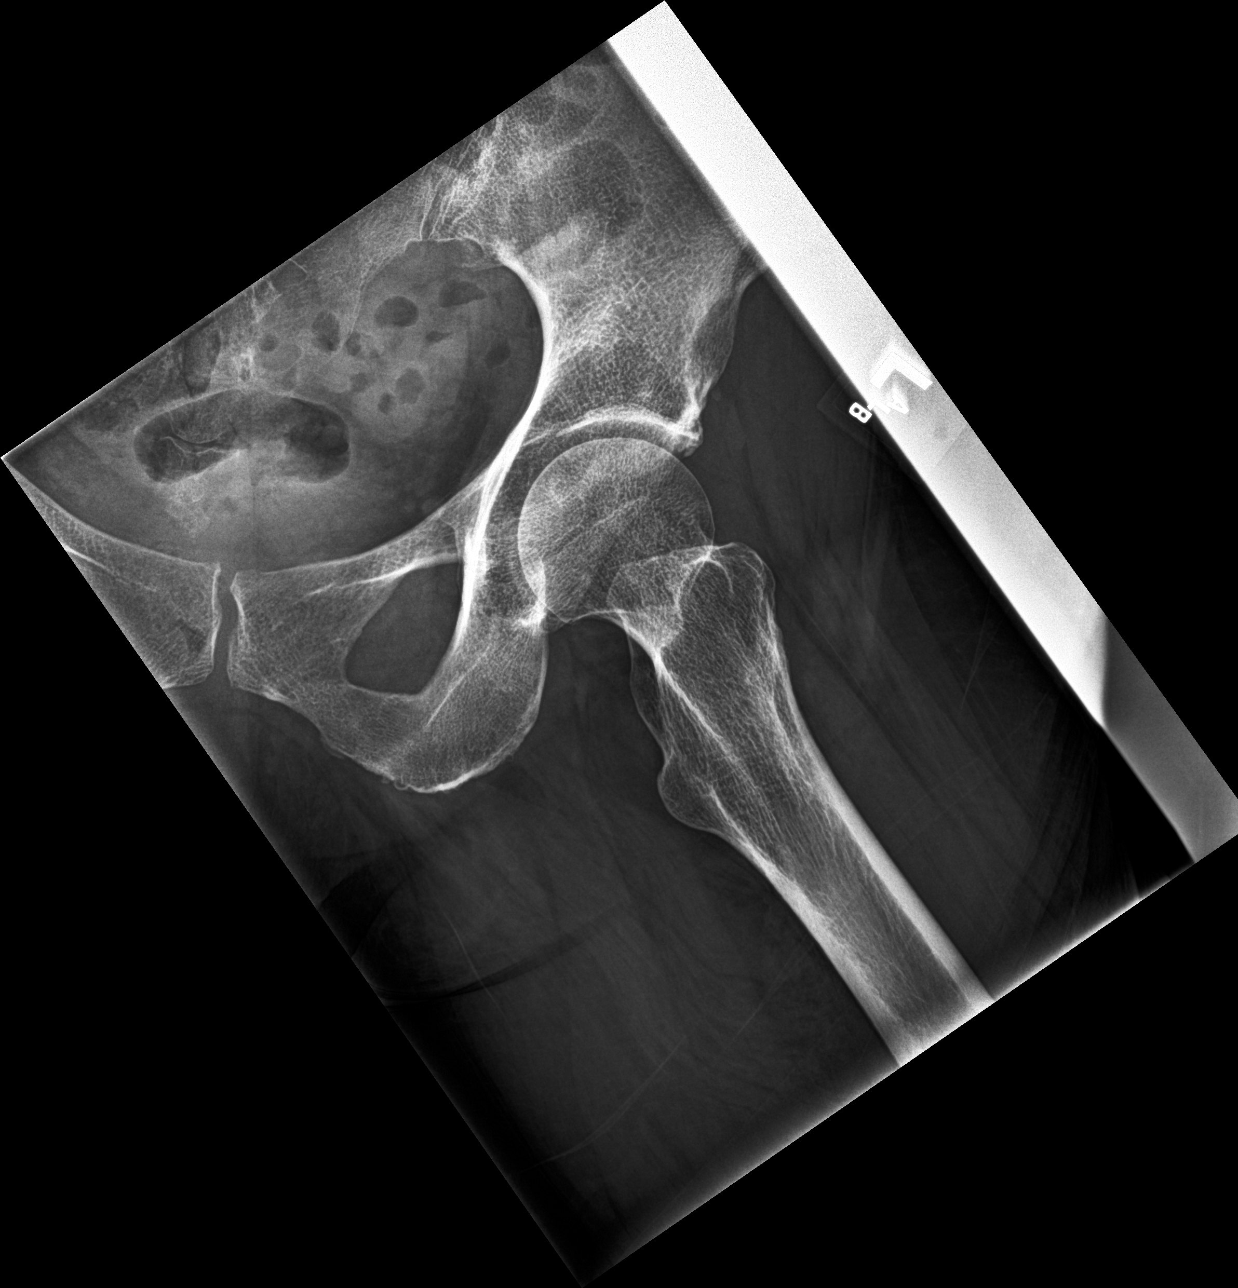

[3 of 3 positions shown; findings below may reference images not displayed]

FINDINGS: There is no evidence of hip fracture or dislocation. There is no
evidence of arthropathy or other focal bone abnormality.
IMPRESSION: Negative exam.

## 2016-07-07 ENCOUNTER — Other Ambulatory Visit: Payer: Self-pay | Admitting: Internal Medicine

## 2016-07-07 DIAGNOSIS — M5416 Radiculopathy, lumbar region: Secondary | ICD-10-CM

## 2016-07-29 ENCOUNTER — Other Ambulatory Visit: Payer: Self-pay | Admitting: Internal Medicine

## 2016-07-29 DIAGNOSIS — F419 Anxiety disorder, unspecified: Secondary | ICD-10-CM

## 2016-07-31 ENCOUNTER — Telehealth: Payer: Self-pay | Admitting: Internal Medicine

## 2016-07-31 NOTE — Telephone Encounter (Signed)
Pt called need refill on Rx Lexapro sent to [facility]

## 2016-07-31 NOTE — Telephone Encounter (Signed)
Already sent to pharmacy 2 days ago.  Check with Georgina Peer to see if there is a message about scheduling follow up.

## 2016-08-05 NOTE — Telephone Encounter (Signed)
Called pt about her medication Rx. Lexapro been sent to [facility]. Pt stated is re-locating/moving this feb. Possibility not making any follow up

## 2016-08-21 ENCOUNTER — Other Ambulatory Visit: Payer: Self-pay | Admitting: Internal Medicine

## 2016-08-21 NOTE — Telephone Encounter (Signed)
Pt is in the process of moving and said she would call back later on if things work out with the house and schedule an appt sometime in the future.

## 2016-08-28 ENCOUNTER — Other Ambulatory Visit: Payer: Self-pay | Admitting: Internal Medicine

## 2016-08-28 DIAGNOSIS — F419 Anxiety disorder, unspecified: Secondary | ICD-10-CM

## 2016-09-04 ENCOUNTER — Other Ambulatory Visit: Payer: Self-pay | Admitting: Internal Medicine

## 2016-09-04 DIAGNOSIS — F419 Anxiety disorder, unspecified: Secondary | ICD-10-CM

## 2016-09-15 ENCOUNTER — Ambulatory Visit (INDEPENDENT_AMBULATORY_CARE_PROVIDER_SITE_OTHER): Payer: Federal, State, Local not specified - PPO | Admitting: Internal Medicine

## 2016-09-15 ENCOUNTER — Encounter: Payer: Self-pay | Admitting: Internal Medicine

## 2016-09-15 VITALS — BP 138/78 | HR 70 | Temp 97.6°F | Ht 65.0 in | Wt 141.0 lb

## 2016-09-15 DIAGNOSIS — F411 Generalized anxiety disorder: Secondary | ICD-10-CM

## 2016-09-15 MED ORDER — BUPROPION HCL ER (SR) 150 MG PO TB12: 150.0000 mg | ORAL_TABLET | Freq: Two times a day (BID) | ORAL | 1 refills | Status: DC

## 2016-09-15 MED ORDER — ESCITALOPRAM OXALATE 5 MG PO TABS: 15.0000 mg | ORAL_TABLET | Freq: Every day | ORAL | 0 refills | Status: DC

## 2016-09-15 NOTE — Patient Instructions (Signed)
Start Bupropion one tablet in the AM and continue Lexapro 10 mg at bedtime for one week then  Bupropion one tablet twice a day and Lexapro 5 mg at bedtime for one week then   Stop Lexapro and continue Bupropion twice a day

## 2016-09-15 NOTE — Progress Notes (Signed)
Date:  09/15/2016   Name:  Sarah Donovan   DOB:  1951-05-18   MRN:  [id number]   Chief Complaint: Anxiety Anxiety  Presents for follow-up visit. Symptoms include irritability, nervous/anxious behavior, obsessions and restlessness. Patient reports no chest pain, shortness of breath or suicidal ideas. The severity of symptoms is interfering with daily activities. The quality of sleep is fair.    She has been taking Lexapro 15 mg a day for several years but it is just not working as well.  She was running out of her Rx so for the last week or two taking only 10 mg per day.  Review of Systems  Constitutional: Positive for irritability and unexpected weight change. Negative for chills and fever.  Respiratory: Negative for cough, shortness of breath and wheezing.   Cardiovascular: Negative for chest pain.  Gastrointestinal: Positive for abdominal pain (mild discomfort and cramping).  Psychiatric/Behavioral: Positive for agitation, dysphoric mood and sleep disturbance (sleeping too much). Negative for suicidal ideas. The patient is nervous/anxious.     Patient Active Problem List   Diagnosis Date Noted  . Hyperlipidemia 03/16/2016  . Trigger finger, acquired 03/16/2016  . OSA on CPAP 03/16/2016  . Anxiety disorder, unspecified 10/30/2015  . GERD without esophagitis 10/30/2015  . Herpes zoster 10/30/2015  . Chronic cervical radiculopathy 10/30/2015  . Lumbar back pain with radiculopathy affecting right lower extremity 10/30/2015    Prior to Admission medications   Medication Sig Start Date End Date Taking? Authorizing Provider  escitalopram (LEXAPRO) 5 MG tablet TAKE 3 TABLETS BY MOUTH DAILY 07/29/16  Yes Glean Hess, MD  naproxen sodium (ANAPROX) 220 MG tablet Take 220 mg by mouth 2 (two) times daily with a meal.   Yes Historical Provider, MD  omeprazole (PRILOSEC) 20 MG capsule TAKE ONE CAPSULE BY MOUTH DAILY 05/27/16  Yes Glean Hess, MD  cyclobenzaprine (FLEXERIL) 10 MG  tablet TAKE 1 TABLET(10 MG) BY MOUTH AT BEDTIME Patient not taking: Reported on 09/15/2016 07/07/16   Glean Hess, MD  Ospemifene (OSPHENA) 60 MG TABS Take 1 tablet by mouth daily.    Historical Provider, MD  traMADol (ULTRAM) 50 MG tablet Take 1 tablet (50 mg total) by mouth every 6 (six) hours as needed. Patient not taking: Reported on 09/15/2016 04/08/16   Glean Hess, MD    Allergies  Allergen Reactions  . Gabapentin Shortness Of Breath  . Aspirin Other (See Comments)  . Penicillins     Past Surgical History:  Procedure Laterality Date  . BREAST BIOPSY    . PARTIAL HYSTERECTOMY     ovaries removed  . VEIN LIGATION AND STRIPPING Left    left leg    Social History  Substance Use Topics  . Smoking status: Former Smoker    Quit date: 10/30/1995  . Smokeless tobacco: Never Used  . Alcohol use 0.0 oz/week     Comment: couple glasses of wine/ 4 beers every few days     Medication list has been reviewed and updated.   Physical Exam  Constitutional: She is oriented to person, place, and time. She appears well-developed. No distress.  HENT:  Head: Normocephalic and atraumatic.  Cardiovascular: Normal rate, regular rhythm and normal heart sounds.   Pulmonary/Chest: Effort normal and breath sounds normal. No respiratory distress. She has no wheezes.  Musculoskeletal: Normal range of motion.  Neurological: She is alert and oriented to person, place, and time.  Skin: Skin is warm and dry. No rash noted.  Psychiatric: Her speech is normal and behavior is normal. Thought content normal. Her mood appears anxious.  Nursing note and vitals reviewed.   BP 138/78   Pulse 70   Temp 97.6 F (36.4 C)   Ht 5\' 5"  (1.651 m)   Wt 141 lb (64 kg)   SpO2 98%   BMI 23.46 kg/m   Assessment and Plan: 1. Generalized anxiety disorder Taper off lexapro and begin Wellbutrin May need to add back a low dose of SSRI - buPROPion (WELLBUTRIN SR) 150 MG 12 hr tablet; Take 1 tablet (150 mg  total) by mouth 2 (two) times daily.  Dispense: 60 tablet; Refill: 1 - escitalopram (LEXAPRO) 5 MG tablet; Take 3 tablets (15 mg total) by mouth daily.  Dispense: 30 tablet; Refill: 0   Halina Maidens, MD [facility]  09/15/2016

## 2016-09-17 ENCOUNTER — Encounter: Payer: Self-pay | Admitting: Internal Medicine

## 2016-09-25 ENCOUNTER — Other Ambulatory Visit: Payer: Self-pay | Admitting: Internal Medicine

## 2016-09-25 DIAGNOSIS — F411 Generalized anxiety disorder: Secondary | ICD-10-CM

## 2016-09-28 ENCOUNTER — Other Ambulatory Visit: Payer: Self-pay | Admitting: Internal Medicine

## 2016-09-28 DIAGNOSIS — F411 Generalized anxiety disorder: Secondary | ICD-10-CM

## 2016-09-28 MED ORDER — ESCITALOPRAM OXALATE 20 MG PO TABS: 20.0000 mg | ORAL_TABLET | Freq: Every day | ORAL | 3 refills | Status: DC

## 2016-11-27 ENCOUNTER — Other Ambulatory Visit: Payer: Self-pay | Admitting: Internal Medicine

## 2016-11-27 DIAGNOSIS — K219 Gastro-esophageal reflux disease without esophagitis: Secondary | ICD-10-CM

## 2017-01-08 DIAGNOSIS — Z8619 Personal history of other infectious and parasitic diseases: Secondary | ICD-10-CM | POA: Insufficient documentation

## 2017-01-08 DIAGNOSIS — F1090 Alcohol use, unspecified, uncomplicated: Secondary | ICD-10-CM | POA: Insufficient documentation

## 2017-01-19 ENCOUNTER — Other Ambulatory Visit: Payer: Self-pay | Admitting: Internal Medicine

## 2017-01-19 DIAGNOSIS — F411 Generalized anxiety disorder: Secondary | ICD-10-CM

## 2017-01-20 NOTE — Telephone Encounter (Signed)
Pt informed to call office for question about Lexapro medication.

## 2017-02-01 ENCOUNTER — Encounter: Payer: Federal, State, Local not specified - PPO | Admitting: Internal Medicine

## 2017-04-26 ENCOUNTER — Other Ambulatory Visit: Payer: Self-pay | Admitting: Internal Medicine

## 2017-04-26 DIAGNOSIS — B029 Zoster without complications: Secondary | ICD-10-CM

## 2017-07-05 ENCOUNTER — Emergency Department
Admission: EM | Admit: 2017-07-05 | Discharge: 2017-07-05 | Disposition: A | Discharge status: Home / Self Care | Payer: Federal, State, Local not specified - PPO | Attending: Emergency Medicine | Admitting: Emergency Medicine

## 2017-07-05 ENCOUNTER — Encounter: Payer: Self-pay | Admitting: Emergency Medicine

## 2017-07-05 ENCOUNTER — Other Ambulatory Visit: Payer: Self-pay

## 2017-07-05 ENCOUNTER — Emergency Department: Payer: Federal, State, Local not specified - PPO

## 2017-07-05 DIAGNOSIS — Z79899 Other long term (current) drug therapy: Secondary | ICD-10-CM | POA: Insufficient documentation

## 2017-07-05 DIAGNOSIS — Z87891 Personal history of nicotine dependence: Secondary | ICD-10-CM | POA: Insufficient documentation

## 2017-07-05 DIAGNOSIS — R079 Chest pain, unspecified: Secondary | ICD-10-CM | POA: Diagnosis present

## 2017-07-05 LAB — BASIC METABOLIC PANEL
ANION GAP: 11 (ref 5–15)
BUN: 15 mg/dL (ref 6–20)
CALCIUM: 9.7 mg/dL (ref 8.9–10.3)
CHLORIDE: 103 mmol/L (ref 101–111)
CO2: 25 mmol/L (ref 22–32)
Creatinine, Ser: 0.83 mg/dL (ref 0.44–1.00)
GFR calc Af Amer: >60 mL/min (ref >60)
GFR calc non Af Amer: >60 mL/min (ref >60)
GLUCOSE: 101 mg/dL — AB (ref 65–99)
Potassium: 4.2 mmol/L (ref 3.5–5.1)
Sodium: 139 mmol/L (ref 135–145)

## 2017-07-05 LAB — CBC
HEMATOCRIT: 42.4 % (ref 35.0–47.0)
HEMOGLOBIN: 14.6 g/dL (ref 12.0–16.0)
MCH: 31.6 pg (ref 26.0–34.0)
MCHC: 34.4 g/dL (ref 32.0–36.0)
MCV: 92.1 fL (ref 80.0–100.0)
Platelets: 189 10*3/uL (ref 150–440)
RBC: 4.61 MIL/uL (ref 3.80–5.20)
RDW: 13.5 % (ref 11.5–14.5)
WBC: 6.5 10*3/uL (ref 3.6–11.0)

## 2017-07-05 LAB — TROPONIN I: Troponin I: <0.03 ng/mL (ref <0.03)

## 2017-07-05 MED ORDER — FLUOXETINE HCL 20 MG PO CAPS: 20.0000 mg | ORAL_CAPSULE | Freq: Every day | ORAL | 11 refills | Status: DC

## 2017-07-05 NOTE — ED Triage Notes (Signed)
Pt reports central chest pain radiating to right side neck for one week. Pt reports associated SOB, indigestion, dizziness, lightheadedness, and back pain. Pt ambulatory to triage. No apparent distress noted.

## 2017-07-05 NOTE — ED Provider Notes (Signed)
[facility] Emergency Department Provider Note   ____________________________________________    I have reviewed the triage vital signs and the nursing notes.   HISTORY  Chief Complaint Chest Pain and Shortness of Breath     HPI Sarah Donovan is a 66 y.o. female who presents with complaints of chest discomfort which she describes as intermittent pressure sensation over the last 5-7 days.  She reports her breathing is normal although occasionally it seems difficult to take a deep breath.  She denies pleurisy.  No fevers or chills or cough.  No recent travel.  No calf pain or swelling.  She attributes her symptoms to anxiety and depression.  She reports during the holidays she typically feels like this.  She reports she has had over the death of her husband 8 years ago.  She is interested in anti-anxiety medications/depression medications.  No nausea or vomiting.  No radiation of pain.  She has not taken anything for this.   Past Medical History:  Diagnosis Date  . Allergic rhinitis   . Anxiety   . Degenerative cervical disc   . GERD (gastroesophageal reflux disease)   . Obstructive sleep apnea    Currently on CPAP  . Spinal stenosis     Patient Active Problem List   Diagnosis Date Noted  . Hyperlipidemia 03/16/2016  . Trigger finger, acquired 03/16/2016  . OSA on CPAP 03/16/2016  . Anxiety disorder 10/30/2015  . GERD without esophagitis 10/30/2015  . Herpes zoster 10/30/2015  . Chronic cervical radiculopathy 10/30/2015  . Lumbar back pain with radiculopathy affecting right lower extremity 10/30/2015    Past Surgical History:  Procedure Laterality Date  . BREAST BIOPSY    . PARTIAL HYSTERECTOMY     ovaries removed  . VEIN LIGATION AND STRIPPING Left    left leg    Prior to Admission medications   Medication Sig Start Date End Date Taking? Authorizing Provider  omeprazole (PRILOSEC) 20 MG capsule TAKE ONE CAPSULE BY MOUTH DAILY 11/28/16   Yes Glean Hess, MD  thiamine 250 MG tablet Take 250 mg by mouth daily.   Yes [provider]  acyclovir (ZOVIRAX) 400 MG tablet TAKE 1 TABLET BY MOUTH TWICE DAILY 04/26/17   Glean Hess, MD  FLUoxetine (PROZAC) 20 MG capsule Take 1 capsule (20 mg total) by mouth daily. 07/05/17 07/05/18  Lavonia Drafts, MD  naproxen sodium (ANAPROX) 220 MG tablet Take 220 mg by mouth 2 (two) times daily with a meal.    [provider]     Allergies Gabapentin; Aspirin; and Penicillins  Family History  Problem Relation Age of Onset  . Lung cancer Mother   . Heart failure Father     Social History Social History   Tobacco Use  . Smoking status: Former Smoker    Last attempt to quit: 10/30/1995    Years since quitting: 21.6  . Smokeless tobacco: Never Used  Substance Use Topics  . Alcohol use: Yes    Alcohol/week: 0.0 oz    Comment: couple glasses of wine/ 4 beers every few days  . Drug use: No    Review of Systems  Constitutional: No fever/chills Eyes: No visual changes.  ENT: No sore throat. Cardiovascular: As above Respiratory: Denies shortness of breath. Gastrointestinal: No abdominal pain.  No nausea, no vomiting.   Genitourinary: Negative for dysuria. Musculoskeletal: Negative for back pain. Skin: Negative for rash. Neurological: Negative for headaches    ____________________________________________   PHYSICAL EXAM:  VITAL SIGNS: ED Triage Vitals  Enc Vitals Group     BP 07/05/17 1128 (!) 179/102     Pulse Rate 07/05/17 1128 77     Resp 07/05/17 1128 18     Temp 07/05/17 1128 98.2 F (36.8 C)     Temp Source 07/05/17 1128 Oral     SpO2 07/05/17 1128 100 %     Weight 07/05/17 1130 61.2 kg (135 lb)     Height 07/05/17 1130 1.626 m (5\' 4" )     Head Circumference --      Peak Flow --      Pain Score 07/05/17 1129 4     Pain Loc --      Pain Edu? --      Excl. in [place]? --     Constitutional: Alert and oriented. No acute distress. Pleasant  and interactive  Nose: No congestion/rhinnorhea. Mouth/Throat: Mucous membranes are moist.   Neck:  Painless ROM Cardiovascular: Normal rate, regular rhythm. Grossly normal heart sounds.  Good peripheral circulation. Respiratory: Normal respiratory effort.  No retractions. Lungs CTAB. Gastrointestinal: Soft and nontender. No distention.  No CVA tenderness. Genitourinary: deferred Musculoskeletal: No lower extremity tenderness nor edema.  Warm and well perfused Neurologic:  Normal speech and language. No gross focal neurologic deficits are appreciated.  Skin:  Skin is warm, dry and intact. No rash noted. Psychiatric: Depressed mood, affect is normal.  Speech and behavior are normal.  ____________________________________________   LABS (all labs ordered are listed, but only abnormal results are displayed)  Labs Reviewed  BASIC METABOLIC PANEL - Abnormal; Notable for the following components:      Result Value   Glucose, Bld 101 (*)    All other components within normal limits  CBC  TROPONIN I   ____________________________________________  EKG  ED ECG REPORT I, Lavonia Drafts, the attending physician, personally viewed and interpreted this ECG.  Date: 07/05/2017  Rhythm: normal sinus rhythm QRS Axis: normal Intervals: normal ST/T Wave abnormalities: normal Narrative Interpretation: no evidence of acute ischemia  ____________________________________________  RADIOLOGY  Chest x-ray unremarkable ____________________________________________   PROCEDURES  Procedure(s) performed: No  Procedures   Critical Care performed: No ____________________________________________   INITIAL IMPRESSION / ASSESSMENT AND PLAN / ED COURSE  Pertinent labs & imaging results that were available during my care of the patient were reviewed by me and considered in my medical decision making (see chart for details).  Patient well-appearing in no acute distress.  EKG is normal.  Labs  are unremarkable.  Differential diagnosis includes ACS, pericarditis, myocarditis, stress reaction, PE  Given reassuring labs and EKG ACS pericarditis myocarditis are unlikely.  I recommended a CT angiography to the patient to rule out PE but she states she is not willing to have an IV nor is she willing to have dye in her blood although she does not have an allergy to it.   She states she would prefer to go home with outpatient follow-up with cardiology and psychiatry for her depression.  I will start her on Prozac.  Return precautions discussed    ____________________________________________   FINAL CLINICAL IMPRESSION(S) / ED DIAGNOSES  Final diagnoses:  Chest pain, unspecified type        Note:  This document was prepared using Dragon voice recognition software and may include unintentional dictation errors.    Lavonia Drafts, MD 07/05/17 [phone]

## 2017-07-07 ENCOUNTER — Telehealth: Payer: Self-pay

## 2017-07-07 NOTE — Telephone Encounter (Signed)
Lmov for patient to call back They were seen in ED on 07/05/17 for CP/SOB  They will be a new patient to office  Will try again at a later time

## 2017-07-16 NOTE — Telephone Encounter (Signed)
Lmov for patient to call back They were seen in ED on 07/05/17 for CP/SOB  They will be a new patient to office  Will try again at a later time

## 2017-07-28 NOTE — Telephone Encounter (Signed)
Sent letter to contact us

## 2017-09-14 DIAGNOSIS — R7989 Other specified abnormal findings of blood chemistry: Secondary | ICD-10-CM | POA: Insufficient documentation

## 2017-09-14 DIAGNOSIS — R7303 Prediabetes: Secondary | ICD-10-CM | POA: Insufficient documentation

## 2017-09-14 DIAGNOSIS — F321 Major depressive disorder, single episode, moderate: Secondary | ICD-10-CM | POA: Insufficient documentation

## 2017-10-13 ENCOUNTER — Other Ambulatory Visit: Payer: Self-pay | Admitting: Internal Medicine

## 2017-10-13 DIAGNOSIS — B029 Zoster without complications: Secondary | ICD-10-CM

## 2017-12-04 ENCOUNTER — Other Ambulatory Visit
Admission: AD | Admit: 2017-12-04 | Discharge: 2017-12-04 | Disposition: A | Discharge status: Home / Self Care | Attending: Family Medicine | Admitting: Family Medicine

## 2017-12-04 NOTE — ED Triage Notes (Signed)
Pt arrives via [employer] for forensic blood draw. [place] [employer] officers Rains and Elinor Parkinson arrived with ambulatory pt who consented to forensic blood draw by this RN. Warrant was present with officers and pt was drawn from left [facility] using provided provodone-iodine prep and blood was drawn on one attempt. Pt's site was clean dry and covered with gauze bandage and paper tape upon pt's leaving ED.

## 2017-12-22 ENCOUNTER — Other Ambulatory Visit
Admission: RE | Admit: 2017-12-22 | Discharge: 2017-12-22 | Disposition: A | Discharge status: Home / Self Care | Payer: Federal, State, Local not specified - PPO | Source: Ambulatory Visit | Attending: Nurse Practitioner | Admitting: Nurse Practitioner

## 2017-12-22 DIAGNOSIS — R197 Diarrhea, unspecified: Secondary | ICD-10-CM | POA: Diagnosis not present

## 2017-12-22 LAB — GASTROINTESTINAL PANEL BY PCR, STOOL (REPLACES STOOL CULTURE)

## 2017-12-22 LAB — C DIFFICILE QUICK SCREEN W PCR REFLEX
C Diff antigen: NEGATIVE
C Diff interpretation: NOT DETECTED
C Diff toxin: NEGATIVE

## 2018-06-20 DIAGNOSIS — M5136 Other intervertebral disc degeneration, lumbar region: Secondary | ICD-10-CM | POA: Insufficient documentation

## 2018-06-20 DIAGNOSIS — K635 Polyp of colon: Secondary | ICD-10-CM | POA: Insufficient documentation

## 2018-06-20 DIAGNOSIS — Z2821 Immunization not carried out because of patient refusal: Secondary | ICD-10-CM | POA: Insufficient documentation

## 2018-06-20 DIAGNOSIS — M51369 Other intervertebral disc degeneration, lumbar region without mention of lumbar back pain or lower extremity pain: Secondary | ICD-10-CM | POA: Insufficient documentation

## 2018-06-20 DIAGNOSIS — R030 Elevated blood-pressure reading, without diagnosis of hypertension: Secondary | ICD-10-CM | POA: Insufficient documentation

## 2018-06-20 DIAGNOSIS — N390 Urinary tract infection, site not specified: Secondary | ICD-10-CM | POA: Insufficient documentation

## 2018-06-20 DIAGNOSIS — Z803 Family history of malignant neoplasm of breast: Secondary | ICD-10-CM | POA: Insufficient documentation

## 2018-06-20 DIAGNOSIS — Z90710 Acquired absence of both cervix and uterus: Secondary | ICD-10-CM | POA: Insufficient documentation

## 2018-09-26 ENCOUNTER — Other Ambulatory Visit: Payer: Self-pay | Admitting: Internal Medicine

## 2018-09-26 DIAGNOSIS — B029 Zoster without complications: Secondary | ICD-10-CM

## 2019-07-03 DIAGNOSIS — G8929 Other chronic pain: Secondary | ICD-10-CM | POA: Insufficient documentation

## 2019-07-21 DIAGNOSIS — M85851 Other specified disorders of bone density and structure, right thigh: Secondary | ICD-10-CM | POA: Insufficient documentation

## 2020-03-07 DIAGNOSIS — M5431 Sciatica, right side: Secondary | ICD-10-CM | POA: Insufficient documentation

## 2020-03-07 DIAGNOSIS — R5383 Other fatigue: Secondary | ICD-10-CM | POA: Insufficient documentation

## 2020-03-12 DIAGNOSIS — E559 Vitamin D deficiency, unspecified: Secondary | ICD-10-CM | POA: Insufficient documentation

## 2020-04-12 DIAGNOSIS — B0089 Other herpesviral infection: Secondary | ICD-10-CM | POA: Insufficient documentation

## 2020-09-03 DIAGNOSIS — Z0000 Encounter for general adult medical examination without abnormal findings: Secondary | ICD-10-CM | POA: Insufficient documentation

## 2020-10-07 DIAGNOSIS — E785 Hyperlipidemia, unspecified: Secondary | ICD-10-CM | POA: Insufficient documentation

## 2020-11-18 DIAGNOSIS — R197 Diarrhea, unspecified: Secondary | ICD-10-CM | POA: Insufficient documentation

## 2022-03-17 ENCOUNTER — Other Ambulatory Visit: Payer: Self-pay | Admitting: Family Medicine

## 2022-03-17 DIAGNOSIS — Z1231 Encounter for screening mammogram for malignant neoplasm of breast: Secondary | ICD-10-CM

## 2022-04-08 ENCOUNTER — Inpatient Hospital Stay: Admission: RE | Admit: 2022-04-08 | Source: Ambulatory Visit

## 2022-04-14 ENCOUNTER — Inpatient Hospital Stay
Admission: RE | Admit: 2022-04-14 | Discharge: 2022-04-14 | Disposition: A | Discharge status: Home / Self Care | Payer: Self-pay | Source: Ambulatory Visit | Attending: *Deleted | Admitting: *Deleted

## 2022-04-14 ENCOUNTER — Other Ambulatory Visit: Payer: Self-pay | Admitting: *Deleted

## 2022-04-14 DIAGNOSIS — Z1231 Encounter for screening mammogram for malignant neoplasm of breast: Secondary | ICD-10-CM

## 2022-04-21 ENCOUNTER — Other Ambulatory Visit: Payer: Self-pay | Admitting: Family Medicine

## 2022-04-21 DIAGNOSIS — N6489 Other specified disorders of breast: Secondary | ICD-10-CM

## 2022-05-07 ENCOUNTER — Ambulatory Visit
Admission: RE | Admit: 2022-05-07 | Discharge: 2022-05-07 | Disposition: A | Discharge status: Home / Self Care | Payer: Federal, State, Local not specified - PPO | Source: Ambulatory Visit | Attending: Family Medicine | Admitting: Family Medicine

## 2022-05-07 DIAGNOSIS — N6489 Other specified disorders of breast: Secondary | ICD-10-CM | POA: Insufficient documentation

## 2022-05-13 ENCOUNTER — Other Ambulatory Visit: Payer: Self-pay | Admitting: Family Medicine

## 2022-05-13 DIAGNOSIS — N63 Unspecified lump in unspecified breast: Secondary | ICD-10-CM

## 2022-05-13 DIAGNOSIS — R928 Other abnormal and inconclusive findings on diagnostic imaging of breast: Secondary | ICD-10-CM

## 2022-05-19 ENCOUNTER — Encounter

## 2022-05-19 ENCOUNTER — Other Ambulatory Visit

## 2022-05-21 ENCOUNTER — Ambulatory Visit
Admission: RE | Admit: 2022-05-21 | Discharge: 2022-05-21 | Disposition: A | Discharge status: Home / Self Care | Payer: Federal, State, Local not specified - PPO | Source: Ambulatory Visit | Attending: Family Medicine | Admitting: Family Medicine

## 2022-05-21 DIAGNOSIS — R928 Other abnormal and inconclusive findings on diagnostic imaging of breast: Secondary | ICD-10-CM | POA: Insufficient documentation

## 2022-05-21 DIAGNOSIS — N63 Unspecified lump in unspecified breast: Secondary | ICD-10-CM | POA: Insufficient documentation

## 2022-05-21 HISTORY — PX: BREAST BIOPSY: SHX20

## 2022-05-25 ENCOUNTER — Encounter: Payer: Self-pay | Admitting: *Deleted

## 2022-05-25 NOTE — Progress Notes (Signed)
Received referral for newly diagnosed breast cancer from [facility].  Navigation initiated.  Ms. Sardinha was given the names of the surgeons and medical oncologists and will call me back when she decides who she would like to see.

## 2022-05-26 ENCOUNTER — Encounter: Payer: Self-pay | Admitting: *Deleted

## 2022-05-26 DIAGNOSIS — C50919 Malignant neoplasm of unspecified site of unspecified female breast: Secondary | ICD-10-CM

## 2022-05-26 NOTE — Progress Notes (Signed)
Ms. Jahr decided she would like to see Dr. Janese Banks and Dr. Christian Mate.  Dr. Janese Banks is scheduled for Monday 10/30 at 2:30.  Referral sent to [facility] for Dr. Christian Mate.

## 2022-05-28 DIAGNOSIS — C50511 Malignant neoplasm of lower-outer quadrant of right female breast: Secondary | ICD-10-CM | POA: Insufficient documentation

## 2022-05-28 NOTE — H&P (View-Only) (Signed)
Patient ID: Sarah Donovan, female   DOB: 06/23/1951, 71 y.o.   MRN: [id number]  Chief Complaint: Right breast cancer  History of Present Illness Sarah Donovan is a 71 y.o. female with recently diagnosed right breast cancer, identified on screening mammography.  1.6 cm estimated size, ER/PR positive.  HER2 equivocal, pending FISH.  Ultrasound of right axilla negative for lymphadenopathy. Had BRCA test drawn today.  She has a maternal grandmother and a sister with a history of breast cancer.  She had a partial hysterectomy, followed by no hormonal replacement.  She began menstruating at the age of 11 she is gravida 3 para 3.  Her first pregnancy was at the age of 18.  She has not noted any breast changes, not performing monthly breast exams, she denies any nipple discharge.  She reports a chronically inverted right nipple. She reports she has had prior right breast biopsies, reportedly all benign.  Past Medical History Past Medical History:  Diagnosis Date   Allergic rhinitis    Anxiety    Degenerative cervical disc    GERD (gastroesophageal reflux disease)    Obstructive sleep apnea    Currently on CPAP   Sciatica of left side    had it a long time per patient   Spinal stenosis       Past Surgical History:  Procedure Laterality Date   BREAST BIOPSY Right 05/21/2022   us bx, ribbon marker, path pending   PARTIAL HYSTERECTOMY     ovaries removed   VEIN LIGATION AND STRIPPING Left    left leg    Allergies  Allergen Reactions   Cynara Scolymus (Artichoke) Shortness Of Breath   Aspirin Other (See Comments)   Penicillins     Current Outpatient Medications  Medication Sig Dispense Refill   acyclovir (ZOVIRAX) 400 MG tablet TAKE 1 TABLET BY MOUTH TWICE DAILY 60 tablet 0   cholecalciferol (VITAMIN D3) 25 MCG (1000 UNIT) tablet Take 1,000 Units by mouth daily. Pt not sure of the amount     COLLAGEN PO Take 1 Dose by mouth daily. Pt says it is a powder and she puts in her coffee      Cyanocobalamin (VITAMIN B 12 PO) Take 1,000 mcg by mouth daily.     gabapentin (NEURONTIN) 100 MG capsule Take 100 mg by mouth 2 (two) times daily.     ibuprofen (ADVIL) 200 MG tablet Take 200 mg by mouth every 6 (six) hours as needed.     Misc Natural Products (MAGIC MUSHROOM MIX PO) Take 1 Dose by mouth daily.     Multiple Vitamin (MULTIVITAMIN ADULT PO) Take 1 capsule by mouth 3 (three) times a week.     naproxen sodium (ANAPROX) 220 MG tablet Take 220 mg by mouth daily as needed.     omeprazole (PRILOSEC) 20 MG capsule TAKE ONE CAPSULE BY MOUTH DAILY 30 capsule 5   rosuvastatin (CRESTOR) 10 MG tablet Take 10 mg by mouth daily.     No current facility-administered medications for this visit.    Family History Family History  Problem Relation Age of Onset   Lung cancer Mother    Heart failure Father    Breast cancer Sister    Breast cancer Maternal Grandmother       Social History Social History   Tobacco Use   Smoking status: Former    Types: Cigarettes    Quit date: 10/30/1995    Years since quitting: 26.6   Smokeless tobacco: Never  Vaping Use     Vaping Use: Never used  Substance Use Topics   Alcohol use: Yes    Alcohol/week: 0.0 standard drinks of alcohol    Comment: couple glasses of wine/ 4 beers every few days   Drug use: No        Review of Systems  Constitutional: Negative.   HENT: Negative.    Eyes: Negative.   Respiratory: Negative.    Cardiovascular:  Positive for claudication.  Gastrointestinal:  Positive for heartburn.  Genitourinary: Negative.   Musculoskeletal:  Positive for back pain and neck pain.  Skin: Negative.   Neurological:  Positive for tingling.  Psychiatric/Behavioral: Negative.        Physical Exam Blood pressure (!) 194/72, pulse (!) 54, temperature 98.2 F (36.8 C), temperature source Oral, height 5' 5" (1.651 m), weight 137 lb (62.1 kg), SpO2 97 %. Last Weight  Most recent update: 06/04/2022  3:30 PM    Weight  62.1 kg (137  lb)             CONSTITUTIONAL: Well developed, and nourished, appropriately responsive and aware without distress.   EYES: Sclera non-icteric.   EARS, NOSE, MOUTH AND THROAT:  The oropharynx is clear. Oral mucosa is pink and moist.  D   Hearing is intact to voice.  NECK: Trachea is midline, and there is no jugular venous distension.  LYMPH NODES:  Lymph nodes in the neck are not enlarged. RESPIRATORY:  Lungs are clear, and breath sounds are equal bilaterally. Normal respiratory effort without pathologic use of accessory muscles. CARDIOVASCULAR: Heart is regular in rate and rhythm. GI: The abdomen is  soft, nontender, and nondistended. There were no palpable masses. GU: Sheila present as chaperone: Right nipple retraction/inversion appears to be unchanging with variations in position.  There is no other evidence of dimpling or retraction in either breast.  No palpable masses, suspicious nodularity or densities present. MUSCULOSKELETAL:  Symmetrical muscle tone appreciated in all four extremities.    SKIN: Skin turgor is normal. No pathologic skin lesions appreciated.  NEUROLOGIC:  Motor and sensation appear grossly normal.  Cranial nerves are grossly without defect. PSYCH:  Alert and oriented to person, place and time. Affect is appropriate for situation.  Data Reviewed I have personally reviewed what is currently available of the patient's imaging, recent labs and medical records.   Labs:     Latest Ref Rng & Units 07/05/2017   11:32 AM  CBC  WBC 3.6 - 11.0 K/uL 6.5   Hemoglobin 12.0 - 16.0 g/dL 14.6   Hematocrit 35.0 - 47.0 % 42.4   Platelets 150 - 440 K/uL 189       Latest Ref Rng & Units 07/05/2017   11:32 AM 03/16/2016    9:08 AM  CMP  Glucose 65 - 99 mg/dL 101  80   BUN 6 - 20 mg/dL 15  15   Creatinine 0.44 - 1.00 mg/dL 0.83  0.83   Sodium 135 - 145 mmol/L 139  139   Potassium 3.5 - 5.1 mmol/L 4.2  4.5   Chloride 101 - 111 mmol/L 103  102   CO2 22 - 32 mmol/L 25  24    Calcium 8.9 - 10.3 mg/dL 9.7  9.0   Total Protein 6.0 - 8.5 g/dL  7.0   Total Bilirubin 0.0 - 1.2 mg/dL  0.5   Alkaline Phos 39 - 117 IU/L  59   AST 0 - 40 IU/L  24   ALT 0 - 32 IU/L  18      SURGICAL PATHOLOGY  * THIS IS AN ADDENDUM REPORT *  CASE: [id number]  PATIENT: Sarah Donovan  Surgical Pathology Report  *Addendum *   Reason for Addendum #1:  Breast Biomarker Results   Specimen Submitted:  A. Breast, right   Clinical History: Irregular mass, concerning for malignancy.  Malignancy   DIAGNOSIS:  A.  Breast, right, 8:00, 4 cm from nipple; core biopsies:  - INVASIVE MAMMARY CARCINOMA, NO SPECIAL TYPE.   Size of invasive carcinoma: 12 mm in this sample  Histologic grade of invasive carcinoma: Grade 1 (of 3)                       Glandular/tubular differentiation score: 2 (of 3)                       Nuclear pleomorphism score: 1 (of 3)                       Mitotic rate score: 1 (of 3)                       Total score: 4 (of 9)  Ductal carcinoma in situ: Present  Lymphovascular invasion: Not identified (abundant retraction artifact  but no definite lymphovascular space invasion).   ER/PR/HER2: Immunohistochemistry will be performed on block A1, with  reflex to [place] for HER2 2+. The results will be reported in an addendum.   Comment:  The definitive grade will be assigned on the excisional specimen.    GROSS DESCRIPTION:  A. Labeled: Right breast 8:00 4 cm from nipple  Received: Formalin  Time/date in fixative: Collected and placed in formalin at 10:15 AM on  05/21/2022  Cold ischemic time: Less than 1 minute  Total fixation time: Approximately 10 hours  Core pieces: 5 cores and 2 additional fragments  Size: Range from 0.6-1.5 cm in length and 0.2 cm in diameter  Description: Received are cores and fragments of tan-yellow fibrofatty  tissue.  The 2 additional fragments are each 0.3 cm in greatest  dimension.  Ink color: Blue  Entirely submitted in cassettes  1-2 with 3 cores in cassette 1 and 2  cores with the 2 remaining fragments in cassette 2.   RB 05/21/2022   Final Diagnosis performed by Theodora Blow, MD.   Electronically signed  05/22/2022 1:28:49PM  The electronic signature indicates that the named Attending Pathologist  has evaluated the specimen  Technical component performed at [facility], [address] [place],  [place] [address] Lab: [phone] Dir: Rush Farmer, MD, MMM   Professional component performed at [facility], [facility], [place], [place], [place] [address] Lab: [phone]  Dir: Kathi Simpers, MD   ADDENDUM:  CASE SUMMARY: BREAST BIOMARKER TESTS  Estrogen Receptor (ER) Status: POSITIVE          Percentage of cells with nuclear positivity: Greater than 90%          Average intensity of staining: Strong   Progesterone Receptor (PgR) Status: POSITIVE          Percentage of cells with nuclear positivity: 51 to 90%          Average intensity of staining: Strong   HER2 (by immunohistochemistry): EQUIVOCAL (Score 2+)      (FISH PENDING)  Ki-67: Not performed    Imaging: Radiological images reviewed:  CLINICAL DATA:  71 year old female presenting for follow-up of a focal asymmetry  in the right breast, noted in December 2022 at an outside institution.   EXAM: DIGITAL DIAGNOSTIC BILATERAL MAMMOGRAM WITH TOMOSYNTHESIS; ULTRASOUND RIGHT BREAST LIMITED   TECHNIQUE: Bilateral digital diagnostic mammography and breast tomosynthesis was performed.; Targeted ultrasound examination of the right breast was performed   COMPARISON:  Previous exam(s).   ACR Breast Density Category c: The breast tissue is heterogeneously dense, which may obscure small masses.   FINDINGS: Right:   There is a focal asymmetry in the lower central right breast posterior depth with associated architectural distortion (CC image 48, MLO image 53). No additional suspicious masses, calcifications, or other findings  in the right breast.   Targeted right breast ultrasound demonstrates an irregular hypoechoic mass with indistinct margins in the 8 o'clock position 4 cm from the nipple. The mass measures 1.6 x 1.4 x 1.1 cm. This mass corresponds with the mammographic finding.   Targeted right axillary ultrasound demonstrates morphologically normal-appearing lymph nodes.   Left:   No suspicious masses, calcifications, or other findings in the left breast.   IMPRESSION: 1. Right breast 1.6 cm irregular mass in the 8 o'clock position is concerning for malignancy. 2. No right axillary lymphadenopathy. 3. No mammographic evidence of malignancy in the left breast.   RECOMMENDATION: Right breast ultrasound-guided biopsy (1 site). Of note, the patient is located in [place] and mentioned preference for further appointments in the breast center in [place].   I have discussed the findings and recommendations with the patient. The biopsy procedure was discussed with the patient and questions were answered. Patient expressed their understanding of the biopsy recommendation. Patient will be scheduled for biopsy at her earliest convenience by the schedulers. Ordering provider will be notified. If applicable, a reminder letter will be sent to the patient regarding the next appointment.   BI-RADS CATEGORY  5: Highly suggestive of malignancy.     Electronically Signed   By: Beryle Flock M.D.   On: 05/07/2022 12:57 Within last 24 hrs: No results found.  Assessment    Invasive right breast cancer, lower outer quadrant, ER positive.  HER2 pending. Patient Active Problem List   Diagnosis Date Noted   Malignant neoplasm of upper-outer quadrant of right breast in female, estrogen receptor positive ([place]) 06/01/2022   Malignant neoplasm of lower-outer quadrant of right breast of female, estrogen receptor positive ([place]) 05/28/2022   Diarrhea 11/18/2020   Dyslipidemia 10/07/2020   Healthcare  maintenance 09/03/2020   Herpetic dermatitis 04/12/2020   Vitamin D deficiency 03/12/2020   Fatigue 03/07/2020   Right sided sciatica 03/07/2020   Osteopenia of both hips 07/21/2019   Chronic right hip pain 07/03/2019   Chronic right shoulder pain 07/03/2019   Colon polyp 06/20/2018   DDD (degenerative disc disease), lumbar 06/20/2018   Elevated blood pressure reading 06/20/2018   Family history of breast cancer in sister 06/20/2018   History of hysterectomy for benign disease 06/20/2018   Recurrent UTI 06/20/2018   Refused influenza vaccine 06/20/2018   Current moderate episode of major depressive disorder without prior episode ([place]) 09/14/2017   Elevated TSH 09/14/2017   Prediabetes 09/14/2017   Alcohol use disorder 01/08/2017   H/O herpes zoster 01/08/2017   Mixed hyperlipidemia 03/16/2016   Trigger finger, acquired 03/16/2016   Sleep apnea 03/16/2016   Anxiety and depression 10/30/2015   Gastroesophageal reflux disease without esophagitis 10/30/2015   Herpes zoster 10/30/2015   Chronic cervical radiculopathy 10/30/2015   Lumbar back pain with radiculopathy affecting right lower extremity 10/30/2015    Plan  RFID tag to right breast lumpectomy, with right sentinel lymph node biopsy.  I discussed the available options with the patient. The risk of recurrence is similar between mastectomy and lumpectomy with radiation.  I also discussed that given the small size of the cancer would recommend localization lumpectomy with radiation to follow.  I also discussed that we would need to do a sentinel lymph node biopsy to check the nodes.  Despite her undergoing a BRCA testing, I do not believe at her age prophylactic mastectomy would be warranted.  Explained to the patient that after her surgical treatment additional treatment will depend on her current cancer prognostic indicators and stage.    I discussed risks of bleeding, infection, damage to surrounding tissues, having positive  margins, needing further resection, damage to nerves causing arm numbness or difficulty raising arm, causing lymphedema in the arm; as well as anesthesia risks of MI, stroke, prolonged ventilation, pulmonary embolism, thrombosis and even death.   Patient was given the opportunity to ask questions and have them answered.  She would like to proceed with right breast RFID localized lumpectomy with sentinel lymph node biopsy.    Face-to-face time spent with the patient and accompanying care providers(if present) was 40 minutes, with more than 50% of the time spent counseling, educating, and coordinating care of the patient.    These notes generated with voice recognition software. I apologize for typographical errors.  Noam Franzen M.D., FACS 06/04/2022, 4:28 PM     

## 2022-05-28 NOTE — Progress Notes (Signed)
Patient ID: Sarah Donovan, female   DOB: Mar 29, 1951, 71 y.o.   MRN: [id number]  Chief Complaint: Right breast cancer  History of Present Illness Sarah Donovan is a 72 y.o. female with recently diagnosed right breast cancer, identified on screening mammography.  1.6 cm estimated size, ER/PR positive.  HER2 equivocal, pending FISH.  Ultrasound of right axilla negative for lymphadenopathy. Had BRCA test drawn today.  She has a maternal grandmother and a sister with a history of breast cancer.  She had a partial hysterectomy, followed by no hormonal replacement.  She began menstruating at the age of 74 she is gravida 3 para 3.  Her first pregnancy was at the age of 57.  She has not noted any breast changes, not performing monthly breast exams, she denies any nipple discharge.  She reports a chronically inverted right nipple. She reports she has had prior right breast biopsies, reportedly all benign.  Past Medical History Past Medical History:  Diagnosis Date   Allergic rhinitis    Anxiety    Degenerative cervical disc    GERD (gastroesophageal reflux disease)    Obstructive sleep apnea    Currently on CPAP   Sciatica of left side    had it a long time per patient   Spinal stenosis       Past Surgical History:  Procedure Laterality Date   BREAST BIOPSY Right 05/21/2022   [place] bx, ribbon marker, path pending   PARTIAL HYSTERECTOMY     ovaries removed   VEIN LIGATION AND STRIPPING Left    left leg    Allergies  Allergen Reactions   Cynara Scolymus ([place]ich[place]) Shortness Of Breath   Aspirin Other (See Comments)   Penicillins     Current Outpatient Medications  Medication Sig Dispense Refill   acyclovir (ZOVIRAX) 400 MG tablet TAKE 1 TABLET BY MOUTH TWICE DAILY 60 tablet 0   cholecalciferol (VITAMIN D3) 25 MCG (1000 UNIT) tablet Take 1,000 Units by mouth daily. Pt not sure of the amount     COLLAGEN PO Take 1 Dose by mouth daily. Pt says it is a powder and she puts in her coffee      Cyanocobalamin (VITAMIN B 12 PO) Take 1,000 mcg by mouth daily.     gabapentin (NEURONTIN) 100 MG capsule Take 100 mg by mouth 2 (two) times daily.     ibuprofen (ADVIL) 200 MG tablet Take 200 mg by mouth every 6 (six) hours as needed.     Misc Natural Products (MAGIC MUSHROOM MIX PO) Take 1 Dose by mouth daily.     Multiple Vitamin (MULTIVITAMIN ADULT PO) Take 1 capsule by mouth 3 (three) times a week.     naproxen sodium (ANAPROX) 220 MG tablet Take 220 mg by mouth daily as needed.     omeprazole (PRILOSEC) 20 MG capsule TAKE ONE CAPSULE BY MOUTH DAILY 30 capsule 5   rosuvastatin (CRESTOR) 10 MG tablet Take 10 mg by mouth daily.     No current facility-administered medications for this visit.    Family History Family History  Problem Relation Age of Onset   Lung cancer Mother    Heart failure Father    Breast cancer Sister    Breast cancer Maternal Grandmother       Social History Social History   Tobacco Use   Smoking status: Former    Types: Cigarettes    Quit date: 10/30/1995    Years since quitting: 26.6   Smokeless tobacco: Never  Vaping Use  Vaping Use: Never used  Substance Use Topics   Alcohol use: Yes    Alcohol/week: 0.0 standard drinks of alcohol    Comment: couple glasses of wine/ 4 beers every few days   Drug use: No        Review of Systems  Constitutional: Negative.   HENT: Negative.    Eyes: Negative.   Respiratory: Negative.    Cardiovascular:  Positive for claudication.  Gastrointestinal:  Positive for heartburn.  Genitourinary: Negative.   Musculoskeletal:  Positive for back pain and neck pain.  Skin: Negative.   Neurological:  Positive for tingling.  Psychiatric/Behavioral: Negative.        Physical Exam Blood pressure (!) 194/72, pulse (!) 54, temperature 98.2 F (36.8 C), temperature source Oral, height 5' 5" (1.651 m), weight 137 lb (62.1 kg), SpO2 97 %. Last Weight  Most recent update: 06/04/2022  3:30 PM    Weight  62.1 kg (137  lb)             CONSTITUTIONAL: Well developed, and nourished, appropriately responsive and aware without distress.   EYES: Sclera non-icteric.   EARS, NOSE, MOUTH AND THROAT:  The oropharynx is clear. Oral mucosa is pink and moist.  D   Hearing is intact to voice.  NECK: Trachea is midline, and there is no jugular venous distension.  LYMPH NODES:  Lymph nodes in the neck are not enlarged. RESPIRATORY:  Lungs are clear, and breath sounds are equal bilaterally. Normal respiratory effort without pathologic use of accessory muscles. CARDIOVASCULAR: Heart is regular in rate and rhythm. GI: The abdomen is  soft, nontender, and nondistended. There were no palpable masses. GU: Freda Munro present as chaperone: Right nipple retraction/inversion appears to be unchanging with variations in position.  There is no other evidence of dimpling or retraction in either breast.  No palpable masses, suspicious nodularity or densities present. MUSCULOSKELETAL:  Symmetrical muscle tone appreciated in all four extremities.    SKIN: Skin turgor is normal. No pathologic skin lesions appreciated.  NEUROLOGIC:  Motor and sensation appear grossly normal.  Cranial nerves are grossly without defect. PSYCH:  Alert and oriented to person, place and time. Affect is appropriate for situation.  Data Reviewed I have personally reviewed what is currently available of the patient's imaging, recent labs and medical records.   Labs:     Latest Ref Rng & Units 07/05/2017   11:32 AM  CBC  WBC 3.6 - 11.0 K/uL 6.5   Hemoglobin 12.0 - 16.0 g/dL 14.6   Hematocrit 35.0 - 47.0 % 42.4   Platelets 150 - 440 K/uL 189       Latest Ref Rng & Units 07/05/2017   11:32 AM 03/16/2016    9:08 AM  CMP  Glucose 65 - 99 mg/dL 101  80   BUN 6 - 20 mg/dL 15  15   Creatinine 0.44 - 1.00 mg/dL 0.83  0.83   Sodium 135 - 145 mmol/L 139  139   Potassium 3.5 - 5.1 mmol/L 4.2  4.5   Chloride 101 - 111 mmol/L 103  102   CO2 22 - 32 mmol/L 25  24    Calcium 8.9 - 10.3 mg/dL 9.7  9.0   Total Protein 6.0 - 8.5 g/dL  7.0   Total Bilirubin 0.0 - 1.2 mg/dL  0.5   Alkaline Phos 39 - 117 IU/L  59   AST 0 - 40 IU/L  24   ALT 0 - 32 IU/L  18  SURGICAL PATHOLOGY  * THIS IS AN ADDENDUM REPORT *  CASE: [id number]  PATIENT: Beatriz Stallion  Surgical Pathology Report  *Addendum *   Reason for Addendum #1:  Breast Biomarker Results   Specimen Submitted:  A. Breast, right   Clinical History: Irregular mass, concerning for malignancy.  Malignancy   DIAGNOSIS:  A.  Breast, right, 8:00, 4 cm from nipple; core biopsies:  - INVASIVE MAMMARY CARCINOMA, NO SPECIAL TYPE.   Size of invasive carcinoma: 12 mm in this sample  Histologic grade of invasive carcinoma: Grade 1 (of 3)                       Glandular/tubular differentiation score: 2 (of 3)                       Nuclear pleomorphism score: 1 (of 3)                       Mitotic rate score: 1 (of 3)                       Total score: 4 (of 9)  Ductal carcinoma in situ: Present  Lymphovascular invasion: Not identified (abundant retraction artifact  but no definite lymphovascular space invasion).   ER/PR/HER2: Immunohistochemistry will be performed on block A1, with  reflex to [place] for HER2 2+. The results will be reported in an addendum.   Comment:  The definitive grade will be assigned on the excisional specimen.    GROSS DESCRIPTION:  A. Labeled: Right breast 8:00 4 cm from nipple  Received: Formalin  Time/date in fixative: Collected and placed in formalin at 10:15 AM on  05/21/2022  Cold ischemic time: Less than 1 minute  Total fixation time: Approximately 10 hours  Core pieces: 5 cores and 2 additional fragments  Size: Range from 0.6-1.5 cm in length and 0.2 cm in diameter  Description: Received are cores and fragments of tan-yellow fibrofatty  tissue.  The 2 additional fragments are each 0.3 cm in greatest  dimension.  Ink color: Blue  Entirely submitted in cassettes  1-2 with 3 cores in cassette 1 and 2  cores with the 2 remaining fragments in cassette 2.   RB 05/21/2022   Final Diagnosis performed by Theodora Blow, MD.   Electronically signed  05/22/2022 1:28:49PM  The electronic signature indicates that the named Attending Pathologist  has evaluated the specimen  Technical component performed at [facility], [address] [place],  [place] [address] Lab: [phone] Dir: Rush Farmer, MD, MMM   Professional component performed at [facility], [facility], [place], [place], [place] [address] Lab: [phone]  Dir: Kathi Simpers, MD   ADDENDUM:  CASE SUMMARY: BREAST BIOMARKER TESTS  Estrogen Receptor (ER) Status: POSITIVE          Percentage of cells with nuclear positivity: Greater than 90%          Average intensity of staining: Strong   Progesterone Receptor (PgR) Status: POSITIVE          Percentage of cells with nuclear positivity: 51 to 90%          Average intensity of staining: Strong   HER2 (by immunohistochemistry): EQUIVOCAL (Score 2+)      (FISH PENDING)  Ki-67: Not performed    Imaging: Radiological images reviewed:  CLINICAL DATA:  71 year old female presenting for follow-up of a focal asymmetry  in the right breast, noted in December 2022 at an outside institution.   EXAM: DIGITAL DIAGNOSTIC BILATERAL MAMMOGRAM WITH TOMOSYNTHESIS; ULTRASOUND RIGHT BREAST LIMITED   TECHNIQUE: Bilateral digital diagnostic mammography and breast tomosynthesis was performed.; Targeted ultrasound examination of the right breast was performed   COMPARISON:  Previous exam(s).   ACR Breast Density Category c: The breast tissue is heterogeneously dense, which may obscure small masses.   FINDINGS: Right:   There is a focal asymmetry in the lower central right breast posterior depth with associated architectural distortion (CC image 48, MLO image 53). No additional suspicious masses, calcifications, or other findings  in the right breast.   Targeted right breast ultrasound demonstrates an irregular hypoechoic mass with indistinct margins in the 8 o'clock position 4 cm from the nipple. The mass measures 1.6 x 1.4 x 1.1 cm. This mass corresponds with the mammographic finding.   Targeted right axillary ultrasound demonstrates morphologically normal-appearing lymph nodes.   Left:   No suspicious masses, calcifications, or other findings in the left breast.   IMPRESSION: 1. Right breast 1.6 cm irregular mass in the 8 o'clock position is concerning for malignancy. 2. No right axillary lymphadenopathy. 3. No mammographic evidence of malignancy in the left breast.   RECOMMENDATION: Right breast ultrasound-guided biopsy (1 site). Of note, the patient is located in [place] and mentioned preference for further appointments in the breast center in [place].   I have discussed the findings and recommendations with the patient. The biopsy procedure was discussed with the patient and questions were answered. Patient expressed their understanding of the biopsy recommendation. Patient will be scheduled for biopsy at her earliest convenience by the schedulers. Ordering provider will be notified. If applicable, a reminder letter will be sent to the patient regarding the next appointment.   BI-RADS CATEGORY  5: Highly suggestive of malignancy.     Electronically Signed   By: Beryle Flock M.D.   On: 05/07/2022 12:57 Within last 24 hrs: No results found.  Assessment    Invasive right breast cancer, lower outer quadrant, ER positive.  HER2 pending. Patient Active Problem List   Diagnosis Date Noted   Malignant neoplasm of upper-outer quadrant of right breast in female, estrogen receptor positive ([place]) 06/01/2022   Malignant neoplasm of lower-outer quadrant of right breast of female, estrogen receptor positive ([place]) 05/28/2022   Diarrhea 11/18/2020   Dyslipidemia 10/07/2020   Healthcare  maintenance 09/03/2020   Herpetic dermatitis 04/12/2020   Vitamin D deficiency 03/12/2020   Fatigue 03/07/2020   Right sided sciatica 03/07/2020   Osteopenia of both hips 07/21/2019   Chronic right hip pain 07/03/2019   Chronic right shoulder pain 07/03/2019   Colon polyp 06/20/2018   DDD (degenerative disc disease), lumbar 06/20/2018   Elevated blood pressure reading 06/20/2018   Family history of breast cancer in sister 06/20/2018   History of hysterectomy for benign disease 06/20/2018   Recurrent UTI 06/20/2018   Refused influenza vaccine 06/20/2018   Current moderate episode of major depressive disorder without prior episode ([place]) 09/14/2017   Elevated TSH 09/14/2017   Prediabetes 09/14/2017   Alcohol use disorder 01/08/2017   H/O herpes zoster 01/08/2017   Mixed hyperlipidemia 03/16/2016   Trigger finger, acquired 03/16/2016   Sleep apnea 03/16/2016   Anxiety and depression 10/30/2015   Gastroesophageal reflux disease without esophagitis 10/30/2015   Herpes zoster 10/30/2015   Chronic cervical radiculopathy 10/30/2015   Lumbar back pain with radiculopathy affecting right lower extremity 10/30/2015    Plan  RFID tag to right breast lumpectomy, with right sentinel lymph node biopsy.  I discussed the available options with the patient. The risk of recurrence is similar between mastectomy and lumpectomy with radiation.  I also discussed that given the small size of the cancer would recommend localization lumpectomy with radiation to follow.  I also discussed that we would need to do a sentinel lymph node biopsy to check the nodes.  Despite her undergoing a BRCA testing, I do not believe at her age prophylactic mastectomy would be warranted.  Explained to the patient that after her surgical treatment additional treatment will depend on her current cancer prognostic indicators and stage.    I discussed risks of bleeding, infection, damage to surrounding tissues, having positive  margins, needing further resection, damage to nerves causing arm numbness or difficulty raising arm, causing lymphedema in the arm; as well as anesthesia risks of MI, stroke, prolonged ventilation, pulmonary embolism, thrombosis and even death.   Patient was given the opportunity to ask questions and have them answered.  She would like to proceed with right breast RFID localized lumpectomy with sentinel lymph node biopsy.    Face-to-face time spent with the patient and accompanying care providers(if present) was 40 minutes, with more than 50% of the time spent counseling, educating, and coordinating care of the patient.    These notes generated with voice recognition software. I apologize for typographical errors.  Ronny Bacon M.D., FACS 06/04/2022, 4:28 PM

## 2022-05-29 ENCOUNTER — Encounter: Payer: Self-pay | Admitting: Oncology

## 2022-05-29 LAB — SURGICAL PATHOLOGY

## 2022-06-01 ENCOUNTER — Inpatient Hospital Stay: Payer: Federal, State, Local not specified - PPO | Attending: Oncology | Admitting: Oncology

## 2022-06-01 ENCOUNTER — Encounter: Payer: Self-pay | Admitting: Oncology

## 2022-06-01 ENCOUNTER — Other Ambulatory Visit: Payer: Self-pay

## 2022-06-01 ENCOUNTER — Inpatient Hospital Stay: Payer: Federal, State, Local not specified - PPO

## 2022-06-01 ENCOUNTER — Encounter: Payer: Self-pay | Admitting: *Deleted

## 2022-06-01 VITALS — BP 160/69 | HR 54 | Temp 98.4°F | Resp 16 | Ht 65.0 in | Wt 136.6 lb

## 2022-06-01 DIAGNOSIS — Z801 Family history of malignant neoplasm of trachea, bronchus and lung: Secondary | ICD-10-CM

## 2022-06-01 DIAGNOSIS — Z8041 Family history of malignant neoplasm of ovary: Secondary | ICD-10-CM

## 2022-06-01 DIAGNOSIS — Z90710 Acquired absence of both cervix and uterus: Secondary | ICD-10-CM

## 2022-06-01 DIAGNOSIS — Z170 Estrogen receptor positive status [ER+]: Secondary | ICD-10-CM | POA: Diagnosis not present

## 2022-06-01 DIAGNOSIS — Z800 Family history of malignant neoplasm of digestive organs: Secondary | ICD-10-CM | POA: Diagnosis not present

## 2022-06-01 DIAGNOSIS — Z87891 Personal history of nicotine dependence: Secondary | ICD-10-CM | POA: Diagnosis not present

## 2022-06-01 DIAGNOSIS — Z7189 Other specified counseling: Secondary | ICD-10-CM | POA: Insufficient documentation

## 2022-06-01 DIAGNOSIS — Z803 Family history of malignant neoplasm of breast: Secondary | ICD-10-CM | POA: Diagnosis not present

## 2022-06-01 DIAGNOSIS — C50411 Malignant neoplasm of upper-outer quadrant of right female breast: Secondary | ICD-10-CM

## 2022-06-01 DIAGNOSIS — C50919 Malignant neoplasm of unspecified site of unspecified female breast: Secondary | ICD-10-CM

## 2022-06-01 NOTE — Progress Notes (Signed)
Hematology/Oncology Consult note [facility] Telephone:([phone] [phone]  Patient Care Team: Peggye Form, NP as PCP - General (Family Medicine) Daiva Huge, RN as Oncology Nurse Navigator   Name of the patient: Sarah Donovan  [id number]  [birth year]    Reason for referral-new diagnosis of breast cancer   Referring physician-Glenda fields NP  Date of visit: 06/01/22   History of presenting illness-patient is a 71 year old female with a past medical history significant for obstructive sleep apnea hyperlipidemia and GERDWhat what is the plan ultimate plan for her now with a new diagnosis of breast onset.  She underwent a diagnostic mammogram in October 2023 after she was noted to have a focal asymmetry on imaging study in December 2022 at an outside institution.  Mammogram showed a 1.6 x 1.4 x 1.1 cm mass at the 8 o'clock position 4 cm from the nipple in the right breast.  No suspicious appearing abnormal lymph nodes.  No masses in the left breast.  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  Family history significant for breast cancer in her sister at 4, maternal grandmother had breast cancer in her 22s.  Maternal aunt had ovarian cancer.  There is also history of pancreatic and liver cancer in the family.  Menarche at the age of 71.  She is G3, P3.  She had previously taken birth control pills.  She had a hysterectomy at the age of 71.  She had a breast biopsy back in 2000 which was benign.  ECOG PS- 1  Pain scale- 0   Review of systems- Review of Systems  Constitutional:  Negative for chills, fever, malaise/fatigue and weight loss.  HENT:  Negative for congestion, ear discharge and nosebleeds.   Eyes:  Negative for blurred vision.  Respiratory:  Negative for cough, hemoptysis, sputum production, shortness of  breath and wheezing.   Cardiovascular:  Negative for chest pain, palpitations, orthopnea and claudication.  Gastrointestinal:  Negative for abdominal pain, blood in stool, constipation, diarrhea, heartburn, melena, nausea and vomiting.  Genitourinary:  Negative for dysuria, flank pain, frequency, hematuria and urgency.  Musculoskeletal:  Negative for back pain, joint pain and myalgias.  Skin:  Negative for rash.  Neurological:  Negative for dizziness, tingling, focal weakness, seizures, weakness and headaches.  Endo/Heme/Allergies:  Does not bruise/bleed easily.  Psychiatric/Behavioral:  Negative for depression and suicidal ideas. The patient does not have insomnia.     Allergies  Allergen Reactions   Cynara Scolymus (Artichoke) Shortness Of Breath   Aspirin Other (See Comments)   Penicillins     Patient Active Problem List   Diagnosis Date Noted   Malignant neoplasm of lower-outer quadrant of right breast of female, estrogen receptor positive ([place]) 05/28/2022   Hyperlipidemia 03/16/2016   Trigger finger, acquired 03/16/2016   OSA on CPAP 03/16/2016   Anxiety disorder 10/30/2015   GERD without esophagitis 10/30/2015   Herpes zoster 10/30/2015   Chronic cervical radiculopathy 10/30/2015   Lumbar back pain with radiculopathy affecting right lower extremity 10/30/2015     Past Medical History:  Diagnosis Date   Allergic rhinitis    Anxiety    Degenerative cervical disc    GERD (gastroesophageal reflux disease)    Obstructive sleep apnea    Currently on CPAP   Sciatica of left side    had it a long  time per patient   Spinal stenosis      Past Surgical History:  Procedure Laterality Date   BREAST BIOPSY Right 05/21/2022   [place] bx, ribbon marker, path pending   PARTIAL HYSTERECTOMY     ovaries removed   VEIN LIGATION AND STRIPPING Left    left leg    Social History   Socioeconomic History   Marital status: Unknown    Spouse name: Not on file   Number of children:  Not on file   Years of education: Not on file   Highest education level: Not on file  Occupational History   Not on file  Tobacco Use   Smoking status: Former    Types: Cigarettes    Quit date: 10/30/1995    Years since quitting: 26.6   Smokeless tobacco: Never  Vaping Use   Vaping Use: Never used  Substance and Sexual Activity   Alcohol use: Yes    Alcohol/week: 0.0 standard drinks of alcohol    Comment: couple glasses of wine/ 4 beers every few days   Drug use: No   Sexual activity: Not Currently  Other Topics Concern   Not on file  Social History Narrative   Not on file   Social Determinants of Health   Financial Resource Strain: Not on file  Food Insecurity: Not on file  Transportation Needs: Not on file  Physical Activity: Not on file  Stress: Not on file  Social Connections: Not on file  Intimate Partner Violence: Not on file     Family History  Problem Relation Age of Onset   Lung cancer Mother    Heart failure Father    Breast cancer Sister    Breast cancer Maternal Grandmother      Current Outpatient Medications:    acyclovir (ZOVIRAX) 400 MG tablet, TAKE 1 TABLET BY MOUTH TWICE DAILY, Disp: 60 tablet, Rfl: 0   cholecalciferol (VITAMIN D3) 25 MCG (1000 UNIT) tablet, Take 1,000 Units by mouth daily. Pt not sure of the amount, Disp: , Rfl:    COLLAGEN PO, Take 1 Dose by mouth daily. Pt says it is a powder and she puts in her coffee, Disp: , Rfl:    Cyanocobalamin (VITAMIN B 12 PO), Take 1,000 mcg by mouth daily., Disp: , Rfl:    gabapentin (NEURONTIN) 100 MG capsule, Take 100 mg by mouth 2 (two) times daily., Disp: , Rfl:    ibuprofen (ADVIL) 200 MG tablet, Take 200 mg by mouth every 6 (six) hours as needed., Disp: , Rfl:    Misc Natural Products (MAGIC MUSHROOM MIX PO), Take 1 Dose by mouth daily., Disp: , Rfl:    Multiple Vitamin (MULTIVITAMIN ADULT PO), Take 1 capsule by mouth 3 (three) times a week., Disp: , Rfl:    naproxen sodium (ANAPROX) 220 MG  tablet, Take 220 mg by mouth daily as needed., Disp: , Rfl:    omeprazole (PRILOSEC) 20 MG capsule, TAKE ONE CAPSULE BY MOUTH DAILY, Disp: 30 capsule, Rfl: 5   rosuvastatin (CRESTOR) 10 MG tablet, Take 10 mg by mouth daily., Disp: , Rfl:    Physical exam:  Vitals:   06/01/22 1439  BP: (!) 160/69  Pulse: (!) 54  Resp: 16  Temp: 98.4 F (36.9 C)  TempSrc: Oral  Weight: 136 lb 9.6 oz (62 kg)  Height: 5' 5" (1.651 m)   Physical Exam Constitutional:      General: She is not in acute distress. Cardiovascular:     Rate and  Rhythm: Normal rate and regular rhythm.     Heart sounds: Normal heart sounds.  Pulmonary:     Effort: Pulmonary effort is normal.     Breath sounds: Normal breath sounds.  Abdominal:     General: Bowel sounds are normal.     Palpations: Abdomen is soft.  Skin:    General: Skin is warm and dry.  Neurological:     Mental Status: She is alert and oriented to person, place, and time.   Breast exam: There is a 1.5 cm palpable mass at the 8 o'clock position of the right breast.  No palpable right axillary adenopathy.  No abnormal findings in the left breast       Latest Ref Rng & Units 07/05/2017   11:32 AM  CMP  Glucose 65 - 99 mg/dL 101   BUN 6 - 20 mg/dL 15   Creatinine 0.44 - 1.00 mg/dL 0.83   Sodium 135 - 145 mmol/L 139   Potassium 3.5 - 5.1 mmol/L 4.2   Chloride 101 - 111 mmol/L 103   CO2 22 - 32 mmol/L 25   Calcium 8.9 - 10.3 mg/dL 9.7       Latest Ref Rng & Units 07/05/2017   11:32 AM  CBC  WBC 3.6 - 11.0 K/uL 6.5   Hemoglobin 12.0 - 16.0 g/dL 14.6   Hematocrit 35.0 - 47.0 % 42.4   Platelets 150 - 440 K/uL 189     No images are attached to the encounter.  [place] RT BREAST BX W LOC DEV 1ST LESION IMG BX SPEC US GUIDE  Addendum Date: 05/25/2022   ADDENDUM REPORT: 05/25/2022 13:17 ADDENDUM: PATHOLOGY revealed: A. Breast, right, 8:00, 4 cm from nipple; core biopsies: - INVASIVE MAMMARY CARCINOMA, NO SPECIAL TYPE. Size of invasive carcinoma: 12 mm  in this sample. Grade 1 (of 3). Ductal carcinoma in situ: Present. Lymphovascular invasion: Not identified (abundant retraction artifact but no definite lymphovascular space invasion). Pathology results are CONCORDANT with imaging findings, per Dr. Valentino Saxon. Pathology results and recommendations were discussed with patient via telephone on 05/22/2022. Patient reported biopsy site doing well with no adverse symptoms, and only slight tenderness at the site. Post biopsy care instructions were reviewed, questions were answered and my direct phone number was provided. Patient was instructed to call [facility] for any additional questions or concerns related to biopsy site. RECOMMENDATIONS: 1. Surgical consultation. Request for surgical consultation relayed to Casper Harrison RN at [facility] by Electa Sniff RN on 05/22/2022. 2. Consider bilateral breast MRI to evaluate for muscle involvement. Pathology results reported by Electa Sniff RN on 05/25/2022. Electronically Signed   By: Valentino Saxon M.D.   On: 05/25/2022 13:17   Result Date: 05/25/2022 CLINICAL DATA:  Suspicious RIGHT breast mass EXAM: ULTRASOUND GUIDED RIGHT BREAST CORE NEEDLE BIOPSY COMPARISON:  Previous exam(s). PROCEDURE: I met with the patient and we discussed the procedure of ultrasound-guided biopsy, including benefits and alternatives. We discussed the high likelihood of a successful procedure. We discussed the risks of the procedure, including infection, bleeding, tissue injury, clip migration, and inadequate sampling. Informed written consent was given. The usual time-out protocol was performed immediately prior to the procedure. Lesion quadrant: Lower outer quadrant Using sterile technique and 1% lidocaine and 1% lidocaine with epinephrine as local anesthetic, under direct ultrasound visualization, a 14 gauge spring-loaded device was used to perform biopsy of a mass at 8 o'clock 4 cm from the nipple using a  inferolateral approach. At  the conclusion of the procedure a RIBBON shaped tissue marker clip was deployed into the biopsy cavity. Follow up 2 view mammogram was performed and dictated separately. IMPRESSION: Ultrasound guided biopsy of a suspicious RIGHT breast mass. No apparent complications. Electronically Signed: By: Valentino Saxon M.D. On: 05/21/2022 11:02   MM CLIP PLACEMENT RIGHT  Result Date: 05/21/2022 CLINICAL DATA:  Status post ultrasound-guided biopsy EXAM: 3D DIAGNOSTIC RIGHT MAMMOGRAM POST ULTRASOUND BIOPSY COMPARISON:  Previous exam(s). FINDINGS: 3D Mammographic images were obtained following ultrasound guided biopsy of mass at 8 o'clock. The RIBBON biopsy marking clip is in expected position at the site of biopsy. IMPRESSION: Appropriate positioning of the RIBBON shaped biopsy marking clip at the site of biopsy in the RIGHT breast at 8 o'clock. Final Assessment: Post Procedure Mammograms for Marker Placement Electronically Signed   By: Valentino Saxon M.D.   On: 05/21/2022 11:01  MM DIAG BREAST TOMO BILATERAL  Result Date: 05/07/2022 CLINICAL DATA:  71 year old female presenting for follow-up of a focal asymmetry in the right breast, noted in December 2022 at an outside institution. EXAM: DIGITAL DIAGNOSTIC BILATERAL MAMMOGRAM WITH TOMOSYNTHESIS; ULTRASOUND RIGHT BREAST LIMITED TECHNIQUE: Bilateral digital diagnostic mammography and breast tomosynthesis was performed.; Targeted ultrasound examination of the right breast was performed COMPARISON:  Previous exam(s). ACR Breast Density Category c: The breast tissue is heterogeneously dense, which may obscure small masses. FINDINGS: Right: There is a focal asymmetry in the lower central right breast posterior depth with associated architectural distortion (CC image 48, MLO image 53). No additional suspicious masses, calcifications, or other findings in the right breast. Targeted right breast ultrasound demonstrates an irregular hypoechoic  mass with indistinct margins in the 8 o'clock position 4 cm from the nipple. The mass measures 1.6 x 1.4 x 1.1 cm. This mass corresponds with the mammographic finding. Targeted right axillary ultrasound demonstrates morphologically normal-appearing lymph nodes. Left: No suspicious masses, calcifications, or other findings in the left breast. IMPRESSION: 1. Right breast 1.6 cm irregular mass in the 8 o'clock position is concerning for malignancy. 2. No right axillary lymphadenopathy. 3. No mammographic evidence of malignancy in the left breast. RECOMMENDATION: Right breast ultrasound-guided biopsy (1 site). Of note, the patient is located in [place] and mentioned preference for further appointments in the breast center in [place]. I have discussed the findings and recommendations with the patient. The biopsy procedure was discussed with the patient and questions were answered. Patient expressed their understanding of the biopsy recommendation. Patient will be scheduled for biopsy at her earliest convenience by the schedulers. Ordering provider will be notified. If applicable, a reminder letter will be sent to the patient regarding the next appointment. BI-RADS CATEGORY  5: Highly suggestive of malignancy. Electronically Signed   By: Beryle Flock M.D.   On: 05/07/2022 12:57  US BREAST LTD UNI RIGHT INC AXILLA  Result Date: 05/07/2022 CLINICAL DATA:  71 year old female presenting for follow-up of a focal asymmetry in the right breast, noted in December 2022 at an outside institution. EXAM: DIGITAL DIAGNOSTIC BILATERAL MAMMOGRAM WITH TOMOSYNTHESIS; ULTRASOUND RIGHT BREAST LIMITED TECHNIQUE: Bilateral digital diagnostic mammography and breast tomosynthesis was performed.; Targeted ultrasound examination of the right breast was performed COMPARISON:  Previous exam(s). ACR Breast Density Category c: The breast tissue is heterogeneously dense, which may obscure small masses. FINDINGS: Right: There is a focal  asymmetry in the lower central right breast posterior depth with associated architectural distortion (CC image 48, MLO image 53). No additional suspicious masses, calcifications, or other findings in the right  breast. Targeted right breast ultrasound demonstrates an irregular hypoechoic mass with indistinct margins in the 8 o'clock position 4 cm from the nipple. The mass measures 1.6 x 1.4 x 1.1 cm. This mass corresponds with the mammographic finding. Targeted right axillary ultrasound demonstrates morphologically normal-appearing lymph nodes. Left: No suspicious masses, calcifications, or other findings in the left breast. IMPRESSION: 1. Right breast 1.6 cm irregular mass in the 8 o'clock position is concerning for malignancy. 2. No right axillary lymphadenopathy. 3. No mammographic evidence of malignancy in the left breast. RECOMMENDATION: Right breast ultrasound-guided biopsy (1 site). Of note, the patient is located in [place] and mentioned preference for further appointments in the breast center in [place]. I have discussed the findings and recommendations with the patient. The biopsy procedure was discussed with the patient and questions were answered. Patient expressed their understanding of the biopsy recommendation. Patient will be scheduled for biopsy at her earliest convenience by the schedulers. Ordering provider will be notified. If applicable, a reminder letter will be sent to the patient regarding the next appointment. BI-RADS CATEGORY  5: Highly suggestive of malignancy. Electronically Signed   By: Beryle Flock M.D.   On: 05/07/2022 12:57   Assessment and plan- Patient is a 71 y.o. 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  Overall patient has a 1.6 cm tumor noted on  mammogram which was a grade 1 ER/PR positive HER2 negative tumor with clinically negative lymph nodes.  Patient will be seeing Dr. Christian Mate to discuss lumpectomy and sentinel lymph node biopsy.  Given personal history of breast cancer and family history of breast and ovarian cancer I am also referring her to genetic counseling and hopefully she can have her genetic testing done prior to surgery.  If she does have BRCA1 or BRCA2 mutation then consideration would be given for bilateral mastectomy.  Otherwise unilateral lumpectomy and sentinel lymph node biopsy should suffice.  Postlumpectomy patient would benefit from adjuvant radiation therapy.  She lives in [place] and I will therefore refer her to Dr. Isidore Moos upon completion of surgery and when final pathology is back.  Discussed that at this point we do not know if she would need adjuvant chemotherapy or not and that Oncotype testing would be indicated.  Discussed what Oncotype testing is and how the results are interpreted.  I will plan to send it off after final pathology is back.  Given that her tumor is ER/PR positive there would be role for endocrine therapy upon completion of adjuvant radiation therapy for at least 5 years.  I will be discussing this in greater detail with her when I see her back after final pathology results are back.  Treatment will be given with a curative intent   Cancer Staging  Malignant neoplasm of upper-outer quadrant of right breast in female, estrogen receptor positive ([place]) Staging form: Breast, AJCC 8th Edition - Clinical stage from 06/01/2022: Stage IA (cT1c, cN0, cM0, G1, ER+, PR+, HER2-) - Signed by Sindy Guadeloupe, MD on 06/01/2022 Stage prefix: Initial diagnosis Histologic grading system: 3 grade system      Thank you for this kind referral and the opportunity to participate in the care of this patient   Visit Diagnosis 1. Goals of care, counseling/discussion   2. Malignant neoplasm of upper-outer  quadrant of right  breast in female, estrogen receptor positive ([place])     Dr. Randa Evens, MD, MPH [facility] at [facility] [phone] 06/01/2022

## 2022-06-01 NOTE — Research (Addendum)
Trial: [employer] 2021-05 - Specimen Collection Study to Evaluate Biomarkers in Subjects with Cancer    Patient Sarah Donovan was identified by this nurse as a potential candidate for the above listed study.  This Clinical Research Nurse met with Sarah Donovan, Sarah Donovan, on 06/01/22 in a manner and location that ensures patient privacy to discuss participation in the above listed research study.  Patient is Unaccompanied.  A copy of the informed consent document and separate HIPAA Authorization was provided to the patient.  Patient reads, speaks, and understands [place].    Patient was provided with the business card of this Nurse and encouraged to contact the research team with any questions.  Patient was provided the option of taking informed consent documents home to review and was encouraged to review at their convenience with their support network, including other care providers. Patient is comfortable with making a decision regarding study participation today.  As outlined in the informed consent form, this Nurse and Sarah Donovan discussed the purpose of the research study, the investigational nature of the study, study procedures and requirements for study participation, potential risks and benefits of study participation, as well as alternatives to participation. This study is not blinded. The patient understands participation is voluntary and they may withdraw from study participation at any time.  This study does not involve randomization.  This study does not involve an investigational drug or device. This study does not involve a placebo. Patient understands enrollment is pending full eligibility review.   Confidentiality and how the patient's information will be used as part of study participation were discussed.  Patient was informed there is reimbursement provided for their time and effort spent on trial participation.  The patient is encouraged to discuss research study participation with  their insurance provider to determine what costs they may incur as part of study participation, including research related injury.    All questions were answered to patient's satisfaction.  The informed consent and separate HIPAA Authorization was reviewed page by page.  The patient's mental and emotional status is appropriate to provide informed consent, and the patient verbalizes an understanding of study participation.  Patient has agreed to participate in the above listed research study and has voluntarily signed the informed consent dated 01 Sep 2021 version 3 and separate HIPAA Authorization, version dated 01 Sep 2021  on 06/01/22 at 300 PM.  The patient was provided with a copy of the signed informed consent form and separate HIPAA Authorization for their reference.  No study specific procedures were obtained prior to the signing of the informed consent document.  Approximately 30 minutes were spent with the patient reviewing the informed consent documents.  After obtaining informed consent patient, voluntarily signed the optional Release of Information form for use throughout trial participation. All eligibility criteria have been reviewed by two RN's per protocol, this RN and Sarah Miu, RN. Patient has been found to be eligible, we will proceed with registration to the study. The patient has an appointment on November 2nd with her surgeon and is in agreement to come in to the cancer center to have her blood drawn for [employer] and genetics at 100 pm. The patient was told she does not have to be fasting to have her labs drawn, encouraged to eat and drink plenty of fluids. Encouraged to call if she has any questions prior to her visit this week for labs.  Sarah Fruit, RN 06/01/22 3:31 PM     This Nurse has reviewed  this patient's inclusion and exclusion criteria as a second review and confirms Sarah Donovan is eligible for study participation.  Patient may continue with  enrollment.  Sarah Marion, RN, BSN, [place] Clinical Research Nurse Lead 06/01/2022 3:46 PM

## 2022-06-01 NOTE — Progress Notes (Signed)
Met with Sarah Donovan after initial medical oncology appointment.   Reviewed Breast Cancer treatment handbook.   Care plan summary given to patient.   Reviewed outreach programs and cancer center services.

## 2022-06-02 ENCOUNTER — Encounter: Payer: Self-pay | Admitting: *Deleted

## 2022-06-02 ENCOUNTER — Telehealth: Payer: Self-pay | Admitting: Licensed Clinical Social Worker

## 2022-06-02 NOTE — Telephone Encounter (Signed)
Pt called this morning and stated she did not want a genetics counseling appt. She only wanted to do the labs. She seemed a little irritated and feels that she already knows her family history and does not want to be billed for counseling visit. I stated I could cancel the appointments and she said she wants to keep the lab appt. I was not sure what to do, so I told her I would get in contact with you to find out what her options are.

## 2022-06-02 NOTE — Progress Notes (Signed)
Called patient to give her appt. With Retail buyer.

## 2022-06-03 ENCOUNTER — Other Ambulatory Visit

## 2022-06-03 ENCOUNTER — Encounter: Admitting: Licensed Clinical Social Worker

## 2022-06-03 HISTORY — PX: BREAST LUMPECTOMY: SHX2

## 2022-06-03 NOTE — Telephone Encounter (Signed)
Will decide when I know about her surgery date

## 2022-06-03 NOTE — Telephone Encounter (Signed)
Yes - that would be fine.  Thank you.

## 2022-06-04 ENCOUNTER — Encounter: Payer: Self-pay | Admitting: Surgery

## 2022-06-04 ENCOUNTER — Ambulatory Visit (INDEPENDENT_AMBULATORY_CARE_PROVIDER_SITE_OTHER): Payer: Federal, State, Local not specified - PPO | Admitting: Surgery

## 2022-06-04 ENCOUNTER — Encounter: Admitting: Licensed Clinical Social Worker

## 2022-06-04 ENCOUNTER — Other Ambulatory Visit

## 2022-06-04 ENCOUNTER — Other Ambulatory Visit: Payer: Self-pay

## 2022-06-04 ENCOUNTER — Inpatient Hospital Stay: Payer: Federal, State, Local not specified - PPO | Attending: Oncology

## 2022-06-04 ENCOUNTER — Encounter: Payer: Self-pay | Admitting: Medical Oncology

## 2022-06-04 ENCOUNTER — Ambulatory Visit: Payer: Self-pay | Admitting: Surgery

## 2022-06-04 VITALS — BP 194/72 | HR 54 | Temp 98.2°F | Ht 65.0 in | Wt 137.0 lb

## 2022-06-04 DIAGNOSIS — C50511 Malignant neoplasm of lower-outer quadrant of right female breast: Secondary | ICD-10-CM

## 2022-06-04 DIAGNOSIS — C50919 Malignant neoplasm of unspecified site of unspecified female breast: Secondary | ICD-10-CM

## 2022-06-04 DIAGNOSIS — Z170 Estrogen receptor positive status [ER+]: Secondary | ICD-10-CM

## 2022-06-04 NOTE — Patient Instructions (Signed)
Our surgery scheduler will call you within 24-48 hours to schedule your surgery. Please have the [place] surgery sheet available when speaking with her.  Lumpectomy  A lumpectomy, sometimes called a partial mastectomy, is surgery to remove a cancerous tumor or mass (the lump) from a breast. It is a form of breast-conserving or breast-preservation surgery. This means that the cancerous tissue is removed but the breast remains intact. During a lumpectomy, the portion of the breast that contains the tumor is removed. Lymph nodes under your arm may also be removed. Lymph nodes are part of the body's disease-fighting system (immune system) and are usually the first place where breast cancer spreads. Tell a health care provider about: Any allergies you have. All medicines you are taking, including vitamins, herbs, eye drops, creams, and over-the-counter medicines. Any problems you or family members have had with anesthetic medicines. Any bleeding problems you have. Any surgeries you have had. Any medical conditions you have. Whether you are pregnant or may be pregnant. What are the risks? Generally, this is a safe procedure. However, problems may occur, including: Bleeding. Infection. Allergic reaction to medicines. Pain, swelling, weakness, or numbness in the arm on the side of your surgery. Temporary swelling. Change in the shape of the breast, especially if a large portion is removed. Scar tissue that forms at the surgical site and feels hard to the touch. Blood clots. What happens before the procedure? When to stop eating and drinking Follow instructions from your health care provider about what you may eat and drink. These may include: 8 hours before your procedure Stop eating most foods. Do not eat meat, fried foods, or fatty foods. Eat only light foods, such as toast or crackers. All liquids are okay except energy drinks and alcohol. 6 hours before your procedure Stop eating. Drink  only clear liquids, such as water, clear fruit juice, black coffee, plain tea, and sports drinks. Do not drink energy drinks or alcohol. 2 hours before your procedure Stop drinking all liquids. You may be allowed to take medicines with small sips of water. If you do not follow your health care provider's instructions, your procedure may be delayed or canceled. Medicines Ask your health care provider about: Changing or stopping your regular medicines. These include any diabetes medicines or blood thinners you take. Taking medicines such as aspirin and ibuprofen. These medicines can thin your blood. Do not take them unless your health care provider tells you to. Taking over-the-counter medicines, vitamins, herbs, and supplements. Surgery safety Ask your health care provider how your surgery site will be marked. A procedure may be done to locate and mark the tumor area in the breast (localization). This will guide the surgeon to where the incision will be made. This may be done with: Imaging, such as a mammogram, ultrasound, or MRI. Insertion of a small wire, clip, or seed, or an implant that will reflect a radar signal. Also, ask what steps will be taken to help prevent infection. These may include: Washing skin with a germ-killing soap. Taking antibiotic medicine. General instructions You may have screening tests or exams to get normal measurements of your arm, also called baseline measurements. These can be compared to measurements done after surgery to monitor for swelling (lymphedema) that can develop after having lymph nodes removed. If you will be going home right after the procedure, plan to have a responsible adult: Take you home from the hospital or clinic. You will not be allowed to drive. Care for you for  the time you are told. What happens during the procedure?  An IV will be inserted into one of your veins. You may be given: A sedative. This helps you relax. Anesthesia. This  will: Numb certain areas of your body. Make you fall asleep for surgery. An electric scalpel will be used to reduce bleeding (electrocautery knife). A curved incision that follows the natural curve of your breast will be made. The tumor will be removed along with some of the tissue around it. This will be sent to the lab for testing. Your health care provider may also remove lymph nodes at this time if needed. If the tumor is close to the muscles over your chest, some muscle tissue may also be removed. A small drain tube may be inserted into your breast area or armpit to collect fluid that may build up after surgery. This tube will be connected to a suction bulb on the outside of your body to remove the fluid. The incision will be closed with stitches (sutures). A bandage (dressing) may be placed over the incision. The procedure may vary among health care providers and hospitals. What happens after the procedure? Your blood pressure, heart rate, breathing rate, and blood oxygen level will be monitored until you leave the hospital or clinic. You will be given medicine for pain as needed. You will be encouraged to get up and walk as soon as you can. This will improve blood flow and breathing. Ask for help if you feel weak or unsteady. You may have a drain tube in place for 2-3 days to prevent a collection of blood (hematoma) from developing in the breast. You may have a pressure bandage applied for 1-2 days to prevent bleeding or swelling. Ask your health care provider how to care for your bandage at home. You may be given a tight sleeve to wear over your arm on the side of your surgery. Wear the sleeve as told by your health care provider. Do not drive or operate machinery until your health care provider says that it is safe. Where to find more information American Cancer Society: cancer.[place]: cancer.gov Summary A lumpectomy, sometimes called a partial mastectomy, is  surgery to remove a cancerous tumor or mass (the lump) from a breast. During a lumpectomy, the portion of the breast that contains the tumor is removed. Plan to have someone take you home from the hospital or clinic. You may have a drain tube in place for 2-3 days to prevent a collection of blood (hematoma) from developing in the breast. This information is not intended to replace advice given to you by your health care provider. Make sure you discuss any questions you have with your health care provider. Document Revised: 10/13/2021 Document Reviewed: 09/28/2021 Elsevier Patient Education  [place].

## 2022-06-04 NOTE — Progress Notes (Signed)
[employer] 2021-05 - Specimen Collection Study to Evaluate Biomarkers in Subjects with Cancer    Patient in clinic today for study specimen collection. This nurse reviewed patient's history after specimen collection.   Eligibility: Eligibility criteria reviewed with patient. This Nurse has reviewed this patient's inclusion and exclusion criteria and confirmed patient is eligible for study participation. Eligibility confirmed by treating investigator, who also agrees that patient should proceed with enrollment. Patient will continue with enrollment. Blood Collection: Research blood obtained by phlebotomist Bill Salinas via Fresh venipuncture. Patient tolerated well without any adverse events. Gift Card: $50 gift card given to patient by research specialist, Mauricio Po, for her participation in this study.    Medical History:  High Blood Pressure  Yes Coronary Artery Disease No Lupus    No Rheumatoid Arthritis  No Diabetes   No      Lynch Syndrome  No  Is the patient currently taking a magnesium supplement?   No  Does the patient have a personal history of cancer (greater than 5 years ago)?  No  Does the patient have a family history of cancer in 1st or 2nd degree relatives? Yes If yes, Relationship(s) and Cancer type(s)? Mother- lung, Sister- breast, Maternal Grandmother- breast, Maternal Aunt- ovarian  Does the patient have history of alcohol consumption? Yes   If yes, current or former? curent Number of years? 41 Drinks per week? 14  Does the patient have history of cigarette, cigar, pipe, or chewing tobacco use?  Yes,  If yes, current for former? former If yes, type (Cigarette, cigar, pipe, and/or chewing tobacco)? cigarette   If former, year stopped? 1999 Number of years? 24 Packs/number/containers per day? 1.5 packs per day  Maxwell Marion, RN, BSN, [place] Nurse Lead 06/04/2022 3:24 PM

## 2022-06-08 ENCOUNTER — Encounter: Payer: Self-pay | Admitting: Surgery

## 2022-06-08 ENCOUNTER — Other Ambulatory Visit: Payer: Self-pay

## 2022-06-08 ENCOUNTER — Other Ambulatory Visit: Payer: Self-pay | Admitting: Surgery

## 2022-06-08 DIAGNOSIS — R928 Other abnormal and inconclusive findings on diagnostic imaging of breast: Secondary | ICD-10-CM

## 2022-06-08 DIAGNOSIS — N6489 Other specified disorders of breast: Secondary | ICD-10-CM

## 2022-06-08 DIAGNOSIS — N63 Unspecified lump in unspecified breast: Secondary | ICD-10-CM

## 2022-06-08 DIAGNOSIS — Z170 Estrogen receptor positive status [ER+]: Secondary | ICD-10-CM

## 2022-06-08 DIAGNOSIS — R921 Mammographic calcification found on diagnostic imaging of breast: Secondary | ICD-10-CM

## 2022-06-08 DIAGNOSIS — C50919 Malignant neoplasm of unspecified site of unspecified female breast: Secondary | ICD-10-CM

## 2022-06-08 NOTE — Addendum Note (Signed)
Encounter addended by: Temple Pacini on: 06/08/2022 11:02 AM  Actions taken: Imaging Exam ended

## 2022-06-09 ENCOUNTER — Telehealth: Payer: Self-pay | Admitting: Surgery

## 2022-06-09 NOTE — Telephone Encounter (Signed)
Patient has been advised of Pre-Admission date/time, and Surgery date at [facility].  Surgery Date: 07/01/22, patient to arrive at 7:30 am as will be having SLN bx done first before surgery.   Preadmission Testing Date: 06/23/22 (phone 8a-1p)  Patient also reminded of RF tag placement 06/17/22

## 2022-06-10 ENCOUNTER — Encounter: Payer: Self-pay | Admitting: *Deleted

## 2022-06-10 DIAGNOSIS — Z170 Estrogen receptor positive status [ER+]: Secondary | ICD-10-CM

## 2022-06-10 DIAGNOSIS — C50919 Malignant neoplasm of unspecified site of unspecified female breast: Secondary | ICD-10-CM

## 2022-06-10 NOTE — Progress Notes (Signed)
Lumpectomy is scheduled for 11/29.  She will see Dr. Janese Banks and Dr. Baruch Gouty on 12/12.  Appt. Details given to her.

## 2022-06-15 ENCOUNTER — Encounter: Payer: Self-pay | Admitting: Licensed Clinical Social Worker

## 2022-06-15 DIAGNOSIS — Z1379 Encounter for other screening for genetic and chromosomal anomalies: Secondary | ICD-10-CM | POA: Insufficient documentation

## 2022-06-17 ENCOUNTER — Ambulatory Visit
Admission: RE | Admit: 2022-06-17 | Discharge: 2022-06-17 | Disposition: A | Discharge status: Home / Self Care | Payer: Federal, State, Local not specified - PPO | Source: Ambulatory Visit | Attending: Surgery | Admitting: Surgery

## 2022-06-17 DIAGNOSIS — C50919 Malignant neoplasm of unspecified site of unspecified female breast: Secondary | ICD-10-CM

## 2022-06-17 MED ORDER — LIDOCAINE HCL (PF) 1 % IJ SOLN
12.0000 mL | Freq: Once | INTRAMUSCULAR | Status: AC
  Administered 2022-06-17: 12 mL via INTRADERMAL
  Filled 2022-06-17: qty 12

## 2022-06-22 ENCOUNTER — Encounter: Payer: Self-pay | Admitting: Licensed Clinical Social Worker

## 2022-06-23 ENCOUNTER — Other Ambulatory Visit: Payer: Self-pay

## 2022-06-23 ENCOUNTER — Encounter
Admission: RE | Admit: 2022-06-23 | Discharge: 2022-06-23 | Disposition: A | Discharge status: Home / Self Care | Payer: Federal, State, Local not specified - PPO | Source: Ambulatory Visit | Attending: Surgery | Admitting: Surgery

## 2022-06-23 HISTORY — DX: Prediabetes: R73.03

## 2022-06-23 HISTORY — DX: Essential (primary) hypertension: I10

## 2022-06-23 HISTORY — DX: Unspecified dementia, unspecified severity, without behavioral disturbance, psychotic disturbance, mood disturbance, and anxiety: F03.90

## 2022-06-23 NOTE — Patient Instructions (Signed)
Your procedure is scheduled on: 07/01/22 Report to [facility]. To find out your arrival time please call [phone] between 1PM - 3PM on 06/30/22.  Remember: Instructions that are not followed completely may result in serious medical risk, up to and including death, or upon the discretion of your surgeon and anesthesiologist your surgery may need to be rescheduled.     _X__ 1. Do not eat food or drink any liquids after midnight the night before your procedure.                 No gum chewing or hard candies.   __X__2.  On the morning of surgery brush your teeth with toothpaste and water, you                 may rinse your mouth with mouthwash if you wish.  Do not swallow any              toothpaste of mouthwash.     _X__ 3.  No Alcohol for 24 hours before or after surgery.   _X__ 4.  Do Not Smoke or use e-cigarettes For 24 Hours Prior to Your Surgery.                 Do not use any chewable tobacco products for at least 6 hours prior to                 surgery.  ____  5.  Bring all medications with you on the day of surgery if instructed.   __X__  6.  Notify your doctor if there is any change in your medical condition      (cold, fever, infections).     Do not wear jewelry, make-up, hairpins, clips or nail polish. Do not wear lotions, powders, or perfumes. No deodorant Do not shave body hair 48 hours prior to surgery. Men may shave face and neck. Do not bring valuables to the hospital.    [facility] is not responsible for any belongings or valuables.  Contacts, dentures/partials or body piercings may not be worn into surgery. Bring a case for your contacts, glasses or hearing aids, a denture cup will be supplied. Leave your suitcase in the car. After surgery it may be brought to your room. For patients admitted to the hospital, discharge time is determined by your treatment team.   Patients discharged the day of surgery will  not be allowed to drive home.    __X__ Take these medicines the morning of surgery with A SIP OF WATER:    1. gabapentin (NEURONTIN) 100 MG capsule   2. omeprazole (PRILOSEC) 20 MG capsule   3. rosuvastatin (CRESTOR) 10 MG tablet   4.  5.  6.  ____ Fleet Enema (as directed)   ____ Use CHG Soap/SAGE wipes as directed  ____ Use inhalers on the day of surgery  ____ Stop metformin/Janumet/Farxiga 2 days prior to surgery    ____ Take 1/2 of usual insulin dose the night before surgery. No insulin the morning          of surgery.   ____ Stop Blood Thinners Coumadin/Plavix/Xarelto/Pleta/Pradaxa/Eliquis/Effient/Aspirin  on   Or contact your Surgeon, Cardiologist or Medical Doctor regarding  ability to stop your blood thinners  __X__ Stop Anti-inflammatories 7 days before surgery such as Advil, Ibuprofen, Motrin,  BC or Goodies Powder, Naprosyn, Naproxen, Aleve, Aspirin   You may use Tylenol if needed  __X__  Stop all herbals and supplements, fish oil or vitamins for 7 days prior to your surgery.   ____ Bring C-Pap to the hospital.

## 2022-07-01 ENCOUNTER — Other Ambulatory Visit: Payer: Self-pay

## 2022-07-01 ENCOUNTER — Encounter
Admission: RE | Admit: 2022-07-01 | Discharge: 2022-07-01 | Disposition: A | Discharge status: Home / Self Care | Payer: Federal, State, Local not specified - PPO | Source: Ambulatory Visit | Attending: Surgery | Admitting: Surgery

## 2022-07-01 ENCOUNTER — Ambulatory Visit
Admission: RE | Admit: 2022-07-01 | Discharge: 2022-07-01 | Disposition: A | Discharge status: Home / Self Care | Payer: Federal, State, Local not specified - PPO | Attending: Surgery | Admitting: Surgery

## 2022-07-01 ENCOUNTER — Encounter
Admission: RE | Disposition: A | Discharge status: Home / Self Care | Payer: Self-pay | Source: Home / Self Care | Attending: Surgery

## 2022-07-01 ENCOUNTER — Ambulatory Visit: Payer: Federal, State, Local not specified - PPO | Admitting: Anesthesiology

## 2022-07-01 ENCOUNTER — Encounter: Payer: Self-pay | Admitting: Surgery

## 2022-07-01 ENCOUNTER — Ambulatory Visit
Admission: RE | Admit: 2022-07-01 | Discharge: 2022-07-01 | Disposition: A | Discharge status: Home / Self Care | Payer: Federal, State, Local not specified - PPO | Source: Ambulatory Visit | Attending: Surgery | Admitting: Surgery

## 2022-07-01 DIAGNOSIS — C50511 Malignant neoplasm of lower-outer quadrant of right female breast: Secondary | ICD-10-CM

## 2022-07-01 DIAGNOSIS — F418 Other specified anxiety disorders: Secondary | ICD-10-CM | POA: Diagnosis not present

## 2022-07-01 DIAGNOSIS — G4733 Obstructive sleep apnea (adult) (pediatric): Secondary | ICD-10-CM | POA: Insufficient documentation

## 2022-07-01 DIAGNOSIS — Z803 Family history of malignant neoplasm of breast: Secondary | ICD-10-CM | POA: Insufficient documentation

## 2022-07-01 DIAGNOSIS — I10 Essential (primary) hypertension: Secondary | ICD-10-CM | POA: Diagnosis not present

## 2022-07-01 DIAGNOSIS — Z87891 Personal history of nicotine dependence: Secondary | ICD-10-CM | POA: Insufficient documentation

## 2022-07-01 DIAGNOSIS — K219 Gastro-esophageal reflux disease without esophagitis: Secondary | ICD-10-CM | POA: Diagnosis not present

## 2022-07-01 DIAGNOSIS — Z170 Estrogen receptor positive status [ER+]: Secondary | ICD-10-CM | POA: Insufficient documentation

## 2022-07-01 DIAGNOSIS — C50911 Malignant neoplasm of unspecified site of right female breast: Secondary | ICD-10-CM | POA: Insufficient documentation

## 2022-07-01 DIAGNOSIS — C50919 Malignant neoplasm of unspecified site of unspecified female breast: Secondary | ICD-10-CM

## 2022-07-01 HISTORY — PX: BREAST LUMPECTOMY,RADIO FREQ LOCALIZER,AXILLARY SENTINEL LYMPH NODE BIOPSY: SHX6900

## 2022-07-01 SURGERY — BREAST LUMPECTOMY,RADIO FREQ LOCALIZER,AXILLARY SENTINEL LYMPH NODE BIOPSY
Anesthesia: General | Site: Breast | Laterality: Right

## 2022-07-01 MED ORDER — FENTANYL CITRATE (PF) 100 MCG/2ML IJ SOLN: 25.0000 ug | INTRAMUSCULAR | Status: DC | PRN

## 2022-07-01 MED ORDER — ONDANSETRON HCL 4 MG/2ML IJ SOLN
INTRAMUSCULAR | Status: AC
  Filled 2022-07-01: qty 2

## 2022-07-01 MED ORDER — ONDANSETRON HCL 4 MG/2ML IJ SOLN
INTRAMUSCULAR | Status: DC | PRN
  Administered 2022-07-01: 4 mg via INTRAVENOUS

## 2022-07-01 MED ORDER — HYDROMORPHONE HCL 1 MG/ML IJ SOLN
INTRAMUSCULAR | Status: DC | PRN
  Administered 2022-07-01: .5 mg via INTRAVENOUS

## 2022-07-01 MED ORDER — CHLORHEXIDINE GLUCONATE CLOTH 2 % EX PADS
6.0000 | MEDICATED_PAD | Freq: Once | CUTANEOUS | Status: AC
  Administered 2022-07-01: 6 via TOPICAL

## 2022-07-01 MED ORDER — PHENYLEPHRINE HCL (PRESSORS) 10 MG/ML IV SOLN
INTRAVENOUS | Status: DC | PRN
  Administered 2022-07-01: 80 ug via INTRAVENOUS

## 2022-07-01 MED ORDER — BUPIVACAINE-EPINEPHRINE (PF) 0.25% -1:200000 IJ SOLN
INTRAMUSCULAR | Status: DC | PRN
  Administered 2022-07-01: 30 mL

## 2022-07-01 MED ORDER — HYDROMORPHONE HCL 1 MG/ML IJ SOLN
INTRAMUSCULAR | Status: AC
  Filled 2022-07-01: qty 1

## 2022-07-01 MED ORDER — ISOSULFAN BLUE 1 % ~~LOC~~ SOLN
SUBCUTANEOUS | Status: DC | PRN
  Administered 2022-07-01: 5 mL via SUBCUTANEOUS

## 2022-07-01 MED ORDER — GABAPENTIN 300 MG PO CAPS: 300.0000 mg | ORAL_CAPSULE | ORAL | Status: AC

## 2022-07-01 MED ORDER — GABAPENTIN 300 MG PO CAPS
ORAL_CAPSULE | ORAL | Status: AC
  Administered 2022-07-01: 300 mg via ORAL
  Filled 2022-07-01: qty 1

## 2022-07-01 MED ORDER — HYDROCODONE-ACETAMINOPHEN 5-325 MG PO TABS: 1.0000 | ORAL_TABLET | Freq: Four times a day (QID) | ORAL | 0 refills | Status: DC | PRN

## 2022-07-01 MED ORDER — CHLORHEXIDINE GLUCONATE 0.12 % MT SOLN
15.0000 mL | Freq: Once | OROMUCOSAL | Status: AC
  Administered 2022-07-01: 15 mL via OROMUCOSAL

## 2022-07-01 MED ORDER — SUGAMMADEX SODIUM 200 MG/2ML IV SOLN
INTRAVENOUS | Status: DC | PRN
  Administered 2022-07-01: 150 mg via INTRAVENOUS

## 2022-07-01 MED ORDER — PROPOFOL 10 MG/ML IV BOLUS
INTRAVENOUS | Status: AC
  Filled 2022-07-01: qty 20

## 2022-07-01 MED ORDER — ORAL CARE MOUTH RINSE: 15.0000 mL | Freq: Once | OROMUCOSAL | Status: AC

## 2022-07-01 MED ORDER — LACTATED RINGERS IV SOLN: INTRAVENOUS | Status: DC

## 2022-07-01 MED ORDER — OXYCODONE HCL 5 MG/5ML PO SOLN: 5.0000 mg | Freq: Once | ORAL | Status: DC | PRN

## 2022-07-01 MED ORDER — SUCCINYLCHOLINE CHLORIDE 200 MG/10ML IV SOSY
PREFILLED_SYRINGE | INTRAVENOUS | Status: DC | PRN
  Administered 2022-07-01: 120 mg via INTRAVENOUS

## 2022-07-01 MED ORDER — TECHNETIUM TC 99M TILMANOCEPT KIT
1.0000 | PACK | Freq: Once | INTRAVENOUS | Status: AC | PRN
  Administered 2022-07-01: 1.094 via INTRADERMAL

## 2022-07-01 MED ORDER — BUPIVACAINE LIPOSOME 1.3 % IJ SUSP
INTRAMUSCULAR | Status: AC
  Filled 2022-07-01: qty 20

## 2022-07-01 MED ORDER — ROCURONIUM BROMIDE 100 MG/10ML IV SOLN
INTRAVENOUS | Status: DC | PRN
  Administered 2022-07-01: 30 mg via INTRAVENOUS

## 2022-07-01 MED ORDER — CEFAZOLIN SODIUM-DEXTROSE 2-4 GM/100ML-% IV SOLN
2.0000 g | INTRAVENOUS | Status: AC
  Administered 2022-07-01: 2 g via INTRAVENOUS

## 2022-07-01 MED ORDER — ACETAMINOPHEN 500 MG PO TABS
ORAL_TABLET | ORAL | Status: AC
  Administered 2022-07-01: 1000 mg via ORAL
  Filled 2022-07-01: qty 2

## 2022-07-01 MED ORDER — ROCURONIUM BROMIDE 10 MG/ML (PF) SYRINGE
PREFILLED_SYRINGE | INTRAVENOUS | Status: AC
  Filled 2022-07-01: qty 10

## 2022-07-01 MED ORDER — OXYCODONE HCL 5 MG PO TABS: 5.0000 mg | ORAL_TABLET | Freq: Once | ORAL | Status: DC | PRN

## 2022-07-01 MED ORDER — ACETAMINOPHEN 500 MG PO TABS: 1000.0000 mg | ORAL_TABLET | ORAL | Status: AC

## 2022-07-01 MED ORDER — LIDOCAINE HCL URETHRAL/MUCOSAL 2 % EX GEL
CUTANEOUS | Status: AC
  Filled 2022-07-01: qty 5

## 2022-07-01 MED ORDER — FENTANYL CITRATE (PF) 100 MCG/2ML IJ SOLN
INTRAMUSCULAR | Status: DC | PRN
  Administered 2022-07-01 (×2): 50 ug via INTRAVENOUS

## 2022-07-01 MED ORDER — LIDOCAINE HCL (CARDIAC) PF 100 MG/5ML IV SOSY
PREFILLED_SYRINGE | INTRAVENOUS | Status: DC | PRN
  Administered 2022-07-01: 60 mg via INTRAVENOUS

## 2022-07-01 MED ORDER — BUPIVACAINE LIPOSOME 1.3 % IJ SUSP: 20.0000 mL | Freq: Once | INTRAMUSCULAR | Status: DC

## 2022-07-01 MED ORDER — EPHEDRINE 5 MG/ML INJ
INTRAVENOUS | Status: AC
  Filled 2022-07-01: qty 5

## 2022-07-01 MED ORDER — EPHEDRINE SULFATE (PRESSORS) 50 MG/ML IJ SOLN
INTRAMUSCULAR | Status: DC | PRN
  Administered 2022-07-01 (×2): 2.5 mg via INTRAVENOUS

## 2022-07-01 MED ORDER — DEXAMETHASONE SODIUM PHOSPHATE 10 MG/ML IJ SOLN
INTRAMUSCULAR | Status: DC | PRN
  Administered 2022-07-01: 5 mg via INTRAVENOUS

## 2022-07-01 MED ORDER — PHENYLEPHRINE 80 MCG/ML (10ML) SYRINGE FOR IV PUSH (FOR BLOOD PRESSURE SUPPORT)
PREFILLED_SYRINGE | INTRAVENOUS | Status: AC
  Filled 2022-07-01: qty 10

## 2022-07-01 MED ORDER — PROPOFOL 10 MG/ML IV BOLUS
INTRAVENOUS | Status: DC | PRN
  Administered 2022-07-01: 30 mg via INTRAVENOUS
  Administered 2022-07-01: 180 mg via INTRAVENOUS
  Administered 2022-07-01: 20 mg via INTRAVENOUS

## 2022-07-01 MED ORDER — FENTANYL CITRATE (PF) 100 MCG/2ML IJ SOLN
INTRAMUSCULAR | Status: AC
  Filled 2022-07-01: qty 2

## 2022-07-01 MED ORDER — CHLORHEXIDINE GLUCONATE CLOTH 2 % EX PADS: 6.0000 | MEDICATED_PAD | Freq: Once | CUTANEOUS | Status: DC

## 2022-07-01 MED ORDER — ISOSULFAN BLUE 1 % ~~LOC~~ SOLN
SUBCUTANEOUS | Status: AC
  Filled 2022-07-01: qty 5

## 2022-07-01 MED ORDER — DEXAMETHASONE SODIUM PHOSPHATE 10 MG/ML IJ SOLN
INTRAMUSCULAR | Status: AC
  Filled 2022-07-01: qty 1

## 2022-07-01 MED ORDER — BUPIVACAINE-EPINEPHRINE (PF) 0.25% -1:200000 IJ SOLN
INTRAMUSCULAR | Status: AC
  Filled 2022-07-01: qty 30

## 2022-07-01 MED ORDER — SUCCINYLCHOLINE CHLORIDE 200 MG/10ML IV SOSY
PREFILLED_SYRINGE | INTRAVENOUS | Status: AC
  Filled 2022-07-01: qty 10

## 2022-07-01 SURGICAL SUPPLY — 44 items
ADH SKN CLS APL DERMABOND .7 (GAUZE/BANDAGES/DRESSINGS) ×1
APL PRP STRL LF DISP 70% ISPRP (MISCELLANEOUS) ×1
APPLIER CLIP 9.375 SM OPEN (CLIP)
APR CLP SM 9.3 20 MLT OPN (CLIP)
BINDER BREAST LRG (GAUZE/BANDAGES/DRESSINGS) IMPLANT
BLADE SURG 15 STRL LF DISP TIS (BLADE) ×1 IMPLANT
BLADE SURG 15 STRL SS (BLADE) ×1
CHLORAPREP W/TINT 26 (MISCELLANEOUS) ×1 IMPLANT
CLIP APPLIE 9.375 SM OPEN (CLIP) IMPLANT
CNTNR SPEC 2.5X3XGRAD LEK (MISCELLANEOUS)
CONT SPEC 4OZ STRL OR WHT (MISCELLANEOUS)
CONTAINER SPEC 2.5X3XGRAD LEK (MISCELLANEOUS) IMPLANT
COVER PROBE GAMMA FINDER SLV (MISCELLANEOUS) ×1 IMPLANT
DERMABOND ADVANCED .7 DNX12 (GAUZE/BANDAGES/DRESSINGS) ×1 IMPLANT
DEVICE DUBIN SPECIMEN MAMMOGRA (MISCELLANEOUS) ×1 IMPLANT
DRAPE LAPAROTOMY TRNSV 106X77 (MISCELLANEOUS) ×1 IMPLANT
ELECT CAUTERY BLADE TIP 2.5 (TIP) ×1
ELECT REM PT RETURN 9FT ADLT (ELECTROSURGICAL) ×1
ELECTRODE CAUTERY BLDE TIP 2.5 (TIP) ×1 IMPLANT
ELECTRODE REM PT RTRN 9FT ADLT (ELECTROSURGICAL) ×1 IMPLANT
GAUZE 4X4 16PLY ~~LOC~~+RFID DBL (SPONGE) ×1 IMPLANT
GLOVE ORTHO TXT STRL SZ7.5 (GLOVE) ×1 IMPLANT
GOWN STRL REUS W/ TWL LRG LVL3 (GOWN DISPOSABLE) ×1 IMPLANT
GOWN STRL REUS W/ TWL XL LVL3 (GOWN DISPOSABLE) ×1 IMPLANT
GOWN STRL REUS W/TWL LRG LVL3 (GOWN DISPOSABLE) ×3
GOWN STRL REUS W/TWL XL LVL3 (GOWN DISPOSABLE) ×1
KIT MARKER MARGIN INK (KITS) IMPLANT
KIT TURNOVER KIT A (KITS) ×1 IMPLANT
MANIFOLD NEPTUNE II (INSTRUMENTS) ×1 IMPLANT
NEEDLE HYPO 22GX1.5 SAFETY (NEEDLE) ×1 IMPLANT
PACK BASIN MINOR ARMC (MISCELLANEOUS) ×1 IMPLANT
SET LOCALIZER 20 PROBE US (MISCELLANEOUS) ×1 IMPLANT
SPIKE FLUID TRANSFER (MISCELLANEOUS) ×1 IMPLANT
SUT MNCRL 4-0 (SUTURE) ×1
SUT MNCRL 4-0 27XMFL (SUTURE) ×1
SUT VIC AB 3-0 SH 27 (SUTURE) ×1
SUT VIC AB 3-0 SH 27X BRD (SUTURE) ×1 IMPLANT
SUTURE MNCRL 4-0 27XMF (SUTURE) ×1 IMPLANT
SYR 10ML LL (SYRINGE) ×1 IMPLANT
SYR 20ML LL LF (SYRINGE) ×1 IMPLANT
TRAP FLUID SMOKE EVACUATOR (MISCELLANEOUS) ×1 IMPLANT
TRAP NEPTUNE SPECIMEN COLLECT (MISCELLANEOUS) ×1 IMPLANT
WATER STERILE IRR 1000ML POUR (IV SOLUTION) ×1 IMPLANT
WATER STERILE IRR 500ML POUR (IV SOLUTION) ×1 IMPLANT

## 2022-07-01 NOTE — Transfer of Care (Signed)
Immediate Anesthesia Transfer of Care Note  Patient: Sarah Donovan  Procedure(s) Performed: BREAST [place] SENTINEL LYMPH NODE BIOPSY (Right: Breast)  Patient Location: PACU  Anesthesia Type:General  Level of Consciousness: awake, drowsy, and patient cooperative  Airway & Oxygen Therapy: Patient Spontanous Breathing and Patient connected to nasal cannula oxygen  Post-op Assessment: Report given to RN, Post -op Vital signs reviewed and stable, and Patient moving all extremities X 4  Post vital signs: Reviewed and stable  Last Vitals:  Vitals Value Taken Time  BP 164/67 07/01/22 1232  Temp 36.6 C 07/01/22 1233  Pulse 79 07/01/22 1235  Resp 17 07/01/22 1235  SpO2 96 % 07/01/22 1235  Vitals shown include unvalidated device data.  Last Pain:  Vitals:   07/01/22 0857  TempSrc: Temporal  PainSc: 0-No pain         Complications: No notable events documented.

## 2022-07-01 NOTE — Op Note (Signed)
Pre-operative Diagnosis: Breast Cancer, right    Post-operative Diagnosis: Same  Surgeon: Ronny Bacon, M.D., [facility]  Anesthesia: General endotracheal  Procedure: Right breast lumpectomy, RFID tag directed, sentinel node biopsy  Procedure Details  The patient was seen again in the Holding Room. The benefits, complications, treatment options, and expected outcomes were discussed with the patient. The risks of bleeding, infection, recurrence of symptoms, failure to resolve symptoms, hematoma, seroma, open wound, cosmetic deformity, and the need for further surgery were discussed.  The patient was taken to Operating Room, identified as Sarah Donovan and the procedure verified.  A Time Out was held and the above information confirmed.  Prior to the induction of general anesthesia, antibiotic prophylaxis was administered. VTE prophylaxis was in place. The patient was positioned in the supine position. Appropriate anesthesia was then administered and tolerated well. The LOCALizer is used to mark the skin for incision.  A visual dye  Isosulfan Blue 4 ml was injected periareolar dermis early under aseptic conditions.  Massage was administered to this area for 5 minutes prior to securely taping it.  The chest was prepped with Chloraprep and draped in the sterile fashion.  Then using the hand-held probe an area of highest count was identified in the axilla, an incision was made and direction by the probe aided in dissection of a lymph node which was blue, had counts of 2000, and background counts were clearly less than 200, the blue lymphatics did not lead to any additional lymph nodes.  The sentinel node was sent for permanent section.  The axillary cavity was then packed with a gauze sponge after confirming adequate hemostasis.  Attention was turned to the RFID tag localization site where an incision was made, circumareolar outer lower quadrant, right breast. Dissection using the LOCALizer to perform a  lumpectomy, unfortunately it extended into the fatty soft tissues and some of these tissues did not maintain the integrity well.  This was done with electrocautery and sharp dissection with Mayo scissors. There was minimal bleeding, and the cavity packed.  The specimen was taken to the back table and painted to demarcate the 6 surfaces of potential margin.  Faxitron images were evaluated.  I felt it prudent to proceed with additional excision of tissues in the vicinity where the tag was localized in order to ensure adequate margins.  This was discussed with Sarah Donovan pathology gave me a call back from his reading.  I returned to the cavity to remove the packing, and hemostasis was confirmed with electrocautery.   Once assuring that hemostasis was adequate and checked multiple times the wound was irrigated, closed with interrupted 3-0 Vicryl followed by 4-0 subcuticular Monocryl sutures.  The axillary wound was closed in a similar fashion. Dermabond is utilized to seal the incision.  Local filtration with quarter percent lidocaine with epinephrine is utilized to both areas upon closure, and deep posterior left within the biopsy cavity.   Findings: Faxitron imaging: Identifying both markers, unfortunately this was in the soft tissue portion of the specimen rather than the dense breast tissue centrally.  So additional margin was taken at the region in question.  Estimated Blood Loss: Minimal         Drains: None         Specimens: Outer lower quadrant right breast tissue, with RFID tag, and radiologic marker present.  Additional inferior with medial and lateral extensions excised from the area for additional margin.       Complications: None.  Condition: Stable  Sentinel Node Biopsy Synoptic Operative Report  Operation performed with curative intent:Yes  Tracer(s) used to identify sentinel nodes in the upfront surgery (non-neoadjuvant) setting (select all that apply):Dye and Radioactive  Tracer  Tracer(s) used to identify sentinel nodes in the neoadjuvant setting (select all that apply):N/A  All nodes (colored or non-colored) present at the end of a dye-filled lymphatic channel were removed:Yes   All significantly radioactive nodes were removed:Yes  All palpable suspicious nodes were removed:Yes  Biopsy-proven positive nodes marked with clips prior to chemotherapy were identified and removed:N/A    Ronny Bacon, M.D., [facility]  07/01/2022 ; 12:31 PM

## 2022-07-01 NOTE — Anesthesia Postprocedure Evaluation (Signed)
Anesthesia Post Note  Patient: Sarah Donovan  Procedure(s) Performed: BREAST LUMPECTOMY,RADIO FREQ LOCALIZER,AXILLARY SENTINEL LYMPH NODE BIOPSY (Right: Breast)  Patient location during evaluation: PACU Anesthesia Type: General Level of consciousness: awake and alert Pain management: pain level controlled Vital Signs Assessment: post-procedure vital signs reviewed and stable Respiratory status: spontaneous breathing, nonlabored ventilation, respiratory function stable and patient connected to nasal cannula oxygen Cardiovascular status: blood pressure returned to baseline and stable Postop Assessment: no apparent nausea or vomiting Anesthetic complications: no   No notable events documented.   Last Vitals:  Vitals:   07/01/22 1309 07/01/22 1325  BP:  (!) 144/55  Pulse: 71 77  Resp: 17 20  Temp: 36.7 C (!) 36.4 C  SpO2: 97% 97%    Last Pain:  Vitals:   07/01/22 1325  TempSrc: Temporal  PainSc: 0-No pain                 Ilene Qua

## 2022-07-01 NOTE — Anesthesia Procedure Notes (Signed)
Procedure Name: Intubation Date/Time: 07/01/2022 10:49 AM  Performed by: Rande Brunt, CRNAPre-anesthesia Checklist: Patient identified, Patient being monitored, Timeout performed, Emergency Drugs available and Suction available Patient Re-evaluated:Patient Re-evaluated prior to induction Oxygen Delivery Method: Circle system utilized Preoxygenation: Pre-oxygenation with 100% oxygen Induction Type: IV induction Ventilation: Mask ventilation without difficulty Laryngoscope Size: 3 and McGraph Grade View: Grade I Tube type: Oral Tube size: 7.0 mm Number of attempts: 1 Airway Equipment and Method: Stylet Placement Confirmation: ETT inserted through vocal cords under direct vision, positive ETCO2 and breath sounds checked- equal and bilateral Secured at: 21 cm Tube secured with: Tape Dental Injury: Teeth and Oropharynx as per pre-operative assessment

## 2022-07-01 NOTE — Interval H&P Note (Signed)
History and Physical Interval Note:  07/01/2022 10:27 AM  Sarah Donovan  has presented today for surgery, with the diagnosis of right breast cancer.  The various methods of treatment have been discussed with the patient and family. After consideration of risks, benefits and other options for treatment, the patient has consented to  Procedure(s): [place] (Right) as a surgical intervention.  The patient's history has been reviewed, patient examined, no change in status, stable for surgery.  I have reviewed the patient's chart and labs.  Questions were answered to the patient's satisfaction.   The right breast is marked.   Ronny Bacon

## 2022-07-01 NOTE — Anesthesia Preprocedure Evaluation (Signed)
Anesthesia Evaluation  Patient identified by MRN, date of birth, ID band Patient awake    Reviewed: Allergy & Precautions, NPO status , Patient's Chart, lab work & pertinent test results  History of Anesthesia Complications Negative for: history of anesthetic complications  Airway Mallampati: II  TM Distance: >3 FB Neck ROM: full    Dental  (+) Chipped   Pulmonary sleep apnea and Continuous Positive Airway Pressure Ventilation , former smoker   Pulmonary exam normal        Cardiovascular hypertension, On Medications negative cardio ROS Normal cardiovascular exam     Neuro/Psych  PSYCHIATRIC DISORDERS Anxiety Depression   Dementia  Neuromuscular disease    GI/Hepatic Neg liver ROS,GERD  Medicated,,  Endo/Other  negative endocrine ROS    Renal/GU      Musculoskeletal   Abdominal   Peds  Hematology negative hematology ROS (+)   Anesthesia Other Findings Past Medical History: No date: Allergic rhinitis No date: Anxiety No date: Degenerative cervical disc No date: Dementia (HCC) No date: GERD (gastroesophageal reflux disease) No date: Hypertension No date: Obstructive sleep apnea     Comment:  Currently on CPAP No date: Pre-diabetes No date: Sciatica of left side     Comment:  had it a long time per patient No date: Spinal stenosis  Past Surgical History: 05/21/2022: BREAST BIOPSY; Right     Comment:  [place] bx, ribbon marker, path pending No date: CARDIAC CATHETERIZATION     Comment:  pt denies vein stripping due to DVT dye to foot No date: PARTIAL HYSTERECTOMY No date: [place]; Left     Comment:  left leg     Reproductive/Obstetrics negative OB ROS                              Anesthesia Physical Anesthesia Plan  ASA: 2  Anesthesia Plan: General ETT   Post-op Pain Management: Tylenol PO (pre-op)* and Toradol IV (intra-op)*   Induction:  Intravenous  PONV Risk Score and Plan: 3 and Ondansetron, Dexamethasone, Midazolam and Treatment may vary due to age or medical condition  Airway Management Planned: Oral ETT  Additional Equipment:   Intra-op Plan:   Post-operative Plan: Extubation in OR  Informed Consent: I have reviewed the patients History and Physical, chart, labs and discussed the procedure including the risks, benefits and alternatives for the proposed anesthesia with the patient or authorized representative who has indicated his/her understanding and acceptance.     Dental Advisory Given  Plan Discussed with: Anesthesiologist, CRNA and Surgeon  Anesthesia Plan Comments: (Patient consented for risks of anesthesia including but not limited to:  - adverse reactions to medications - damage to eyes, teeth, lips or other oral mucosa - nerve damage due to positioning  - sore throat or hoarseness - Damage to heart, brain, nerves, lungs, other parts of body or loss of life  Patient voiced understanding.)         Anesthesia Quick Evaluation

## 2022-07-01 NOTE — Discharge Instructions (Signed)
AMBULATORY SURGERY  ?DISCHARGE INSTRUCTIONS ? ? ?The drugs that you were given will stay in your system until tomorrow so for the next 24 hours you should not: ? ?Drive an automobile ?Make any legal decisions ?Drink any alcoholic beverage ? ? ?You may resume regular meals tomorrow.  Today it is better to start with liquids and gradually work up to solid foods. ? ?You may eat anything you prefer, but it is better to start with liquids, then soup and crackers, and gradually work up to solid foods. ? ? ?Please notify your doctor immediately if you have any unusual bleeding, trouble breathing, redness and pain at the surgery site, drainage, fever, or pain not relieved by medication. ? ? ? ?Additional Instructions: ? ? ? ?Please contact your physician with any problems or Same Day Surgery at [phone], Monday through Friday 6 am to 4 pm, or [place] [facility] [place] Main number at [phone].  ?

## 2022-07-02 ENCOUNTER — Ambulatory Visit (INDEPENDENT_AMBULATORY_CARE_PROVIDER_SITE_OTHER): Payer: Federal, State, Local not specified - PPO | Admitting: Physician Assistant

## 2022-07-02 ENCOUNTER — Encounter: Payer: Self-pay | Admitting: Surgery

## 2022-07-02 VITALS — BP 166/70 | HR 65 | Temp 98.3°F | Ht 65.0 in | Wt 137.0 lb

## 2022-07-02 DIAGNOSIS — Z09 Encounter for follow-up examination after completed treatment for conditions other than malignant neoplasm: Secondary | ICD-10-CM

## 2022-07-02 DIAGNOSIS — Z08 Encounter for follow-up examination after completed treatment for malignant neoplasm: Secondary | ICD-10-CM

## 2022-07-02 DIAGNOSIS — C50511 Malignant neoplasm of lower-outer quadrant of right female breast: Secondary | ICD-10-CM

## 2022-07-02 DIAGNOSIS — Z170 Estrogen receptor positive status [ER+]: Secondary | ICD-10-CM

## 2022-07-02 NOTE — Patient Instructions (Addendum)
Wear a well fitting bra for the next 2 weeks. Only take it off for bathing. This will help reduce any swelling or edema.   Follow up as scheduled.

## 2022-07-02 NOTE — Progress Notes (Signed)
Brief Note Sarah Donovan is 1 day s/p right breast lumpectomy with Dr Abel Presto today because breast binder is stuck to the incision  Dr Christian Mate was able to free this without issue No other issues  She will otherwise follow up as scheduled on 12/07  -- Edison Simon, PA-C [facility] 07/02/2022, 2:13 PM M-F: 7am - 4pm

## 2022-07-06 ENCOUNTER — Encounter: Payer: Self-pay | Admitting: *Deleted

## 2022-07-06 ENCOUNTER — Other Ambulatory Visit: Payer: Self-pay | Admitting: Anatomic Pathology & Clinical Pathology

## 2022-07-06 LAB — SURGICAL PATHOLOGY

## 2022-07-06 NOTE — Progress Notes (Signed)
Oncotype Dx order submitted online on path [phone], order ID [id number]

## 2022-07-09 ENCOUNTER — Ambulatory Visit (INDEPENDENT_AMBULATORY_CARE_PROVIDER_SITE_OTHER): Payer: Federal, State, Local not specified - PPO | Admitting: Surgery

## 2022-07-09 ENCOUNTER — Encounter: Payer: Self-pay | Admitting: Surgery

## 2022-07-09 ENCOUNTER — Other Ambulatory Visit: Payer: Self-pay

## 2022-07-09 VITALS — BP 174/74 | HR 60 | Temp 97.9°F | Ht 65.0 in | Wt 134.0 lb

## 2022-07-09 DIAGNOSIS — C50511 Malignant neoplasm of lower-outer quadrant of right female breast: Secondary | ICD-10-CM

## 2022-07-09 DIAGNOSIS — Z170 Estrogen receptor positive status [ER+]: Secondary | ICD-10-CM

## 2022-07-09 DIAGNOSIS — Z08 Encounter for follow-up examination after completed treatment for malignant neoplasm: Secondary | ICD-10-CM

## 2022-07-09 NOTE — Patient Instructions (Addendum)
Please call the office with any questions or concerns.   We will contact you in 3 months to schedule a follow up mammogram.

## 2022-07-09 NOTE — Progress Notes (Signed)
[facility] POST-OP OFFICE VISIT  07/09/2022  HPI: Sarah Donovan is a 71 y.o. female 8 days s/p RFID tag to right breast lumpectomy, with sentinel lymph node biopsy.  She notes her path report, node negative.  Despite the piecemeal excision, I believe the true margins are all negative.  She has some reservations about proceeding on, questioning if I will refer her back to oncology or for radiation.  I believe she is a little resistant at this time of continuing on additional treatment.  I discussed at length the risk of recurrence and are efforts to minimize this risk.  Vital signs: BP (!) 174/74   Pulse 60   Temp 97.9 F (36.6 C) (Oral)   Ht _0  (1.651 m)   Wt 134 lb (60.8 kg)   SpO2 97%   BMI 22.30 kg/m    Physical Exam: Constitutional: She appears quite well today Skin: Freda Munro is present as chaperone: Her circumareolar incision is well covered with Dermabond.  There is very little induration or hematoma present in the breast itself.  There is some residual ecchymosis resolving around the right axilla.  Both incisions are clean, dry and intact.    Assessment/Plan: This is a 71 y.o. 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  Patient Active Problem List   Diagnosis Date Noted   Genetic testing 06/15/2022   Malignant neoplasm of upper-outer quadrant of right breast in female, estrogen receptor positive ([place]) 06/01/2022   Malignant neoplasm of lower-outer quadrant of right breast of female, estrogen receptor positive ([place]) 05/28/2022   Diarrhea 11/18/2020   Dyslipidemia 10/07/2020   Healthcare maintenance 09/03/2020   Herpetic dermatitis 04/12/2020   Vitamin D deficiency 03/12/2020   Fatigue 03/07/2020   Right sided sciatica 03/07/2020   Osteopenia of both hips 07/21/2019   Chronic right hip pain 07/03/2019   Chronic right shoulder pain 07/03/2019   Colon polyp 06/20/2018   DDD  (degenerative disc disease), lumbar 06/20/2018   Elevated blood pressure reading 06/20/2018   Family history of breast cancer in sister 06/20/2018   History of hysterectomy for benign disease 06/20/2018   Recurrent UTI 06/20/2018   Refused influenza vaccine 06/20/2018   Current moderate episode of major depressive disorder without prior episode ([place]) 09/14/2017   Elevated TSH 09/14/2017   Prediabetes 09/14/2017   Alcohol use disorder 01/08/2017   H/O herpes zoster 01/08/2017   Mixed hyperlipidemia 03/16/2016   Trigger finger, acquired 03/16/2016   Sleep apnea 03/16/2016   Anxiety and depression 10/30/2015   Gastroesophageal reflux disease without esophagitis 10/30/2015   Herpes zoster 10/30/2015   Chronic cervical radiculopathy 10/30/2015   Lumbar back pain with radiculopathy affecting right lower extremity 10/30/2015    -She brought up her knowledge/study of SouR Sop and graViola... And wishes to consider this as an alternative to our standard of care within allopathic medicine.  I will look into these items, but defer expert opinion to her oncologist.  I have strongly advised that she follow-up with Dr. Janese Banks and Dr. Baruch Gouty.  And hear what they have to say regarding completion of her treatment.  I believe she understands the risks of going off course, and I am encouraged her to consider completing full regimen of breast conservation treatment for her invasive breast cancer.  I instructed her will need to continue follow-up, including repeat mammographic imaging in about 6 months.  I would like to see her no later than that in clinical  follow-up as well.   Ronny Bacon M.D., FACS 07/09/2022, 10:07 AM

## 2022-07-13 ENCOUNTER — Inpatient Hospital Stay: Payer: Federal, State, Local not specified - PPO | Attending: Oncology

## 2022-07-13 ENCOUNTER — Ambulatory Visit: Admitting: Oncology

## 2022-07-14 ENCOUNTER — Inpatient Hospital Stay: Payer: Federal, State, Local not specified - PPO | Admitting: Oncology

## 2022-07-14 ENCOUNTER — Encounter: Payer: Self-pay | Admitting: Oncology

## 2022-07-14 ENCOUNTER — Ambulatory Visit: Payer: Federal, State, Local not specified - PPO | Admitting: Radiation Oncology

## 2022-07-14 ENCOUNTER — Encounter: Payer: Self-pay | Admitting: *Deleted

## 2022-07-14 NOTE — Progress Notes (Signed)
Called patient to notify her of low oncotype score.  No answer, had to leave voicemail.  Asked for a return call.

## 2022-07-21 ENCOUNTER — Inpatient Hospital Stay: Payer: Federal, State, Local not specified - PPO | Admitting: Oncology

## 2022-07-21 ENCOUNTER — Ambulatory Visit: Payer: Federal, State, Local not specified - PPO | Attending: Radiation Oncology | Admitting: Radiation Oncology

## 2022-07-21 ENCOUNTER — Encounter: Payer: Self-pay | Admitting: *Deleted

## 2022-07-21 NOTE — Progress Notes (Addendum)
I spoke with Ms. Sarah Donovan and she has decided to not take radiation or antiestrogen therapy.  She is interested in taking Sour Sop supplements instead.   She was given the rational for radiation and anti-estrogen and encouraged to be very diligent with self breast exams.  She doesn't want to follow up with Dr. Janese Donovan or see Dr. Baruch Donovan at this time.  I have also reached out to [name] at [facility] and let her know the patients wishes.

## 2022-08-06 ENCOUNTER — Encounter: Payer: Federal, State, Local not specified - PPO | Admitting: Surgery

## 2022-10-14 ENCOUNTER — Other Ambulatory Visit: Payer: Self-pay

## 2022-10-14 DIAGNOSIS — C50511 Malignant neoplasm of lower-outer quadrant of right female breast: Secondary | ICD-10-CM

## 2022-12-03 ENCOUNTER — Other Ambulatory Visit (HOSPITAL_COMMUNITY): Payer: Self-pay | Admitting: Family Medicine

## 2022-12-03 DIAGNOSIS — G8929 Other chronic pain: Secondary | ICD-10-CM

## 2022-12-03 DIAGNOSIS — R200 Anesthesia of skin: Secondary | ICD-10-CM

## 2022-12-03 DIAGNOSIS — R2689 Other abnormalities of gait and mobility: Secondary | ICD-10-CM

## 2022-12-03 DIAGNOSIS — M48061 Spinal stenosis, lumbar region without neurogenic claudication: Secondary | ICD-10-CM

## 2022-12-03 DIAGNOSIS — M5416 Radiculopathy, lumbar region: Secondary | ICD-10-CM

## 2022-12-04 ENCOUNTER — Other Ambulatory Visit: Payer: Federal, State, Local not specified - PPO

## 2022-12-11 ENCOUNTER — Ambulatory Visit
Admission: RE | Admit: 2022-12-11 | Discharge: 2022-12-11 | Disposition: A | Discharge status: Home / Self Care | Payer: Federal, State, Local not specified - PPO | Source: Ambulatory Visit | Attending: Surgery | Admitting: Surgery

## 2022-12-11 DIAGNOSIS — C50511 Malignant neoplasm of lower-outer quadrant of right female breast: Secondary | ICD-10-CM | POA: Diagnosis present

## 2022-12-11 DIAGNOSIS — Z170 Estrogen receptor positive status [ER+]: Secondary | ICD-10-CM | POA: Insufficient documentation

## 2022-12-14 ENCOUNTER — Other Ambulatory Visit: Payer: Self-pay | Admitting: Surgery

## 2022-12-14 DIAGNOSIS — Z853 Personal history of malignant neoplasm of breast: Secondary | ICD-10-CM

## 2022-12-14 DIAGNOSIS — Z1239 Encounter for other screening for malignant neoplasm of breast: Secondary | ICD-10-CM

## 2022-12-15 ENCOUNTER — Ambulatory Visit: Payer: Medicare Other | Admitting: Surgery

## 2022-12-21 ENCOUNTER — Ambulatory Visit (HOSPITAL_COMMUNITY): Payer: Federal, State, Local not specified - PPO

## 2022-12-21 ENCOUNTER — Encounter (HOSPITAL_COMMUNITY): Payer: Self-pay

## 2023-03-10 ENCOUNTER — Other Ambulatory Visit: Payer: Self-pay | Admitting: Family Medicine

## 2023-03-10 ENCOUNTER — Inpatient Hospital Stay
Admission: RE | Admit: 2023-03-10 | Discharge: 2023-03-10 | Disposition: A | Discharge status: Home / Self Care | Payer: Self-pay | Source: Ambulatory Visit | Attending: Neurosurgery | Admitting: Neurosurgery

## 2023-03-10 DIAGNOSIS — Z049 Encounter for examination and observation for unspecified reason: Secondary | ICD-10-CM

## 2023-03-10 NOTE — Progress Notes (Deleted)
Referring Physician:  Alm Bustard, NP [address] [place],  [place] [address]  Primary Physician:  Alm Bustard, NP  History of Present Illness: 03/10/2023 Ms. Sarah Donovan is here today with a chief complaint of ***  Low back pain  Duration/Date of Injury: *** Mechanism of Injury: *** Specific Nerve if known: {ANATOMY; NERVES:23155} Weakness: ***, {Improving/worsening/no change:60406} Pain ***, {IMPROVED DECREASED NO CHANGE:22066} Numbness ***, {IMPROVED DECREASED NO CHANGE:22066}  Conservative measures:  Physical therapy: {yes/no:20286} Occupational Therapy: {yes/no:20286} Hand Therapy: {yes/no:20286} Injections: {yes/no:20286} Gabapentin: {yes/no:20286}, Lyrica: {yes/no:20286}, Cymbalta: {yes/no:20286}  Past Surgery: {yes/no:20286} Is this a Second Opinion: {yes/no:20286}  Main Question for Surgeon: ***  Review of Systems:  A 10 point review of systems is negative, except for the pertinent positives and negatives detailed in the HPI.  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   BREAST LUMPECTOMY,RADIO FREQ LOCALIZER,AXILLARY SENTINEL LYMPH NODE BIOPSY Right 07/01/2022   Procedure: BREAST LUMPECTOMY,RADIO FREQ LOCALIZER,AXILLARY SENTINEL LYMPH NODE BIOPSY;  Surgeon: Campbell Lerner, MD;  Location: [facility] ORS;  Service: General;  Laterality: Right;   CARDIAC CATHETERIZATION     pt denies vein stripping due to DVT dye to foot   PARTIAL HYSTERECTOMY     VEIN  LIGATION AND STRIPPING Left    left leg    Allergies: Allergies as of 03/15/2023 - Review Complete 07/09/2022  Allergen Reaction Noted   Cynara scolymus (artichoke) Shortness Of Breath 06/01/2022   Aspirin Other (See Comments) 04/09/2016   Penicillins Other (See Comments) 10/30/2015    Medications:  Current Outpatient Medications:    acyclovir (ZOVIRAX) 400 MG tablet, TAKE 1 TABLET BY MOUTH TWICE DAILY, Disp: 60 tablet, Rfl: 0   cholecalciferol (VITAMIN D3) 25 MCG (1000 UNIT) tablet, Take 1,000 Units by mouth daily. Pt not sure of the amount, Disp: , Rfl:    COLLAGEN PO, Take 1 Dose by mouth daily. Pt says it is a powder and she puts in her coffee, Disp: , Rfl:    Cyanocobalamin (VITAMIN B 12 PO), Take 1,000 mcg by mouth daily., Disp: , Rfl:    escitalopram (LEXAPRO) 10 MG tablet, Take 10 mg by mouth daily., Disp: , Rfl:    gabapentin (NEURONTIN) 100 MG capsule, Take 100 mg by mouth 2 (two) times daily., Disp: , Rfl:    ibuprofen (ADVIL) 200 MG tablet, Take 200 mg by mouth every 6 (six) hours as needed., Disp: , Rfl:    MISC NATURAL PRODUCT OP, Take 1,000 mg by mouth 2 (two) times daily. Sour sop, graviola, Disp: , Rfl:    Misc Natural Products (MAGIC MUSHROOM MIX PO), Take 1 Dose by mouth daily. [employer] Product/process development scientist mushroom, Disp: , Rfl:    Multiple Vitamin (MULTIVITAMIN ADULT PO), Take 1 capsule by mouth 3 (three) times a week., Disp: , Rfl:    naproxen sodium (ANAPROX) 220 MG tablet, Take 220 mg by mouth  daily as needed., Disp: , Rfl:    omeprazole (PRILOSEC) 20 MG capsule, TAKE ONE CAPSULE BY MOUTH DAILY, Disp: 30 capsule, Rfl: 5   rosuvastatin (CRESTOR) 10 MG tablet, Take 10 mg by mouth daily., Disp: , Rfl:   Social History: Social History   Tobacco Use   Smoking status: Former    Current packs/day: 0.00    Types: Cigarettes    Quit date: 10/30/1995    Years since quitting: 27.3   Smokeless tobacco: Never  Vaping Use   Vaping status: Never Used  Substance Use Topics   Alcohol  use: Yes    Alcohol/week: 0.0 standard drinks of alcohol    Comment: couple glasses of wine/ 4 beers every few days   Drug use: No    Family Medical History: Family History  Problem Relation Age of Onset   Lung cancer Mother    Heart failure Father    Breast cancer Sister    Breast cancer Maternal Grandmother     Physical Examination: There were no vitals filed for this visit.  General: Patient is in no apparent distress. Attention to examination is appropriate.  Neck:   Supple.  Full range of motion.  Respiratory: Patient is breathing without any difficulty.   NEUROLOGICAL:     Awake, alert, oriented to person, place, and time.  Speech is clear and fluent.   Cranial Nerves: Pupils equal round and reactive to light.  Facial tone is symmetric. Shoulder shrug is symmetric. Tongue protrusion is midline.  There is no pronator drift.  Motor Exam:  ***  Reflexes are ***2+ and symmetric at the biceps, triceps, brachioradialis, patella and achilles.   Hoffman's is absent. Clonus is Absent  Bilateral upper and lower extremity sensation is intact to light touch ***.     Gait is normal.  ***   Medical Decision Making  Imaging: MRI LUMBAR SPINE WITHOUT CONTRAST  INDICATION: Low back pain, progressive neurologic deficit, M54.42 Lumbago with sciatica, left side, G89.29 Other chronic pain, M48.061 Spinal stenosis, lumbar region without neurogenic claudication, M54.16 Radiculopathy, lumbar region, R20.0 Anesthesia of skin, R20.0 Anesthesia of skin, R26.89 Other abnormalities of gait and mobility  COMPARISON: None  TECHNIQUE/PROTOCOL: Extradural protocol MRI of the lumbar spine was performed without contrast administration.  FINDINGS: Anatomical variants: Conventional spinal numbering Alignment: Normal. Conus medullaris: terminates at approximately L1. Spinal Cord and Cauda Equina: Visualized portions of the spinal cord is normal in morphology and signal. Normal  appearance of the cauda equina.   Bone marrow signal: No suspicious lesions. Sacroiliac joints: No significant degenerative changes of the visualized SI joints Regional soft tissues: Unremarkable.  T12-L1: Seen only on sagittal views. No significant neural foraminal stenosis disc height loss.   L1-L2: No spinal canal stenosis or significant disc bulge. L2-L3: Minimal disc bulge without spinal canal stenosis. No significant neural foraminal stenosis. L3-L4: Disc desiccation with mild disc height loss, with mild eccentric disc bulge and mild spinal canal stenosis. Mild bilateral neural foraminal stenosis. L4-L5: Disc desiccation with mild disc height loss with mild disc bulge with an annular fissure of the lateral aspect of the L4 intervertebral disc. Ligamentum flavum hypertrophy with mild spinal canal stenosis. Mild bilateral neural foraminal stenosis.   L5-S1: Disc desiccation with moderate disc height loss. Mild disc bulge, ligamentum flavum hypertrophy with associated mild spinal canal stenosis. Moderate neural foraminal stenosis bilaterally.   IMPRESSION: Multilevel degenerative changes of the lumbar spine, most pronounced at L4-L5 with associated mild disc bulge, ligamentum flavum hypertrophy and  mild spinal canal stenosis.             Electronically Reviewed by:  Matilde Haymaker, MD, [facility] Electronically Reviewed on:  03/03/2023 1:33 PM  I have reviewed the images and concur with the above findings.  Electronically Signed by:  Loreli Slot, MD, [facility] Electronically Signed on:  03/03/2023 5:21 PM Procedure Note  Loreli Slot, MD - 03/03/2023 Formatting of this note might be different from the original. MRI LUMBAR SPINE WITHOUT CONTRAST  INDICATION: Low back pain, progressive neurologic deficit, M54.42 Lumbago with sciatica, left side, G89.29 Other chronic pain, M48.061 Spinal stenosis, lumbar region without neurogenic claudication,  M54.16 Radiculopathy, lumbar region, R20.0 Anesthesia of skin, R20.0 Anesthesia of skin, R26.89 Other abnormalities of gait and mobility  COMPARISON: None  TECHNIQUE/PROTOCOL: Extradural protocol MRI of the lumbar spine was performed without contrast administration.  FINDINGS: Anatomical variants: Conventional spinal numbering Alignment: Normal. Conus medullaris: terminates at approximately L1. Spinal Cord and Cauda Equina: Visualized portions of the spinal cord is normal in morphology and signal. Normal appearance of the cauda equina.   Bone marrow signal: No suspicious lesions. Sacroiliac joints: No significant degenerative changes of the visualized SI joints Regional soft tissues: Unremarkable.  T12-L1: Seen only on sagittal views. No significant neural foraminal stenosis disc height loss.   L1-L2: No spinal canal stenosis or significant disc bulge. L2-L3: Minimal disc bulge without spinal canal stenosis. No significant neural foraminal stenosis. L3-L4: Disc desiccation with mild disc height loss, with mild eccentric disc bulge and mild spinal canal stenosis. Mild bilateral neural foraminal stenosis. L4-L5: Disc desiccation with mild disc height loss with mild disc bulge with an annular fissure of the lateral aspect of the L4 intervertebral disc. Ligamentum flavum hypertrophy with mild spinal canal stenosis. Mild bilateral neural foraminal stenosis.   L5-S1: Disc desiccation with moderate disc height loss. Mild disc bulge, ligamentum flavum hypertrophy with associated mild spinal canal stenosis. Moderate neural foraminal stenosis bilaterally.   IMPRESSION: Multilevel degenerative changes of the lumbar spine, most pronounced at L4-L5 with associated mild disc bulge, ligamentum flavum hypertrophy and mild spinal canal stenosis.             Electronically Reviewed by:  Matilde Haymaker, MD, [facility] Electronically Reviewed on:  03/03/2023 1:33 PM  I have  reviewed the images and concur with the above findings.  Electronically Signed by:  Loreli Slot, MD, [facility] Electronically Signed on:  03/03/2023 5:21 PM Exam End: 03/03/23 08:25   Specimen Collected: 03/03/23 08:01 Last Resulted: 03/03/23 17:21  Received From: [facility]  Result Received: 08/06/2    Electrodiagnostics: ***  I have personally reviewed the images and electrodiagnostics and agree with the above interpretation.  Assessment and Plan: Ms. Rizzuto is a pleasant 72 y.o. female with ***. The symptoms are causing a significant impact on the patient's life. I have utilized the care everywhere function in epic to review the outside records available from external health systems, and have personally reviewed relevant imaging and electrodiagnostic workup.    Thank you for involving me in the care of this patient.    Lovenia Kim MD/MSCR Neurosurgery - Peripheral Nerve Surgery

## 2023-03-15 ENCOUNTER — Ambulatory Visit: Payer: Federal, State, Local not specified - PPO | Admitting: Neurosurgery

## 2023-03-26 NOTE — Progress Notes (Unsigned)
Referring Physician:  Alm Bustard, NP [address] [place],  [place] [address]  Primary Physician:  Alm Bustard, NP  History of Present Illness: 03/29/2023 Sarah Donovan is here today with a chief complaint of left lower extremity pain and weakness.  She states that she has had this weakness for multiple months.  But she also gets sharp shooting pains that go from her buttocks and back down to the anterior lower extremity to the top of her foot.  She states is sharp and stinging.  She also states that she has had worsened sensation.  Notably she has had history of cervical spondylosis.  She has felt that she has been having worsening ability to ambulate.  Feels like her coordination has been off.  She has not noticed significant loss of function in her hands.  She does feel that her pain is improved with rest and worsens with activity.  Conservative measures:  Physical therapy: yes in the past last time was 5 years ago. Aquatic therapy Multimodal medical therapy including regular antiinflammatories: Gabapentin, Naproxen   Injections: no epidural steroid injections  Past Surgery: no  The symptoms are causing a significant impact on the patient's life.   I have utilized the care everywhere function in epic to review the outside records available from external health systems.  Review of Systems:  A 10 point review of systems is negative, except for the pertinent positives and negatives detailed in the HPI.  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   BREAST LUMPECTOMY,RADIO FREQ LOCALIZER,AXILLARY SENTINEL LYMPH NODE BIOPSY Right 07/01/2022   Procedure: BREAST LUMPECTOMY,RADIO FREQ LOCALIZER,AXILLARY SENTINEL LYMPH NODE BIOPSY;  Surgeon: Campbell Lerner, MD;  Location: [facility] ORS;  Service: General;  Laterality: Right;   CARDIAC CATHETERIZATION     pt denies vein stripping due to DVT dye to foot   PARTIAL HYSTERECTOMY     VEIN LIGATION AND STRIPPING Left    left leg    Allergies: Allergies as of 03/29/2023 - Review Complete 07/09/2022  Allergen Reaction Noted   Cynara scolymus (artichoke) Shortness Of Breath 06/01/2022   Aspirin Other (See Comments) 04/09/2016   Penicillins Other (See Comments) 10/30/2015    Medications:  Current Outpatient Medications:    acyclovir (ZOVIRAX) 400 MG tablet, TAKE 1 TABLET BY MOUTH TWICE DAILY, Disp: 60 tablet, Rfl: 0   cholecalciferol (VITAMIN D3) 25 MCG (1000 UNIT) tablet, Take 1,000 Units by mouth daily. Pt not sure of the amount, Disp: , Rfl:    COLLAGEN PO, Take 1 Dose by mouth daily. Pt says it is a powder and she puts in her coffee, Disp: , Rfl:    Cyanocobalamin (VITAMIN B 12 PO), Take 1,000 mcg by mouth daily., Disp: , Rfl:    escitalopram (LEXAPRO) 10 MG tablet, Take 10 mg by mouth daily., Disp: , Rfl:    gabapentin (NEURONTIN)  100 MG capsule, Take 100 mg by mouth 2 (two) times daily., Disp: , Rfl:    ibuprofen (ADVIL) 200 MG tablet, Take 200 mg by mouth every 6 (six) hours as needed., Disp: , Rfl:    MISC NATURAL PRODUCT OP, Take 1,000 mg by mouth 2 (two) times daily. Sour sop, graviola, Disp: , Rfl:    Misc Natural Products (MAGIC MUSHROOM MIX PO), Take 1 Dose by mouth daily. [employer] Product/process development scientist mushroom, Disp: , Rfl:    Multiple Vitamin (MULTIVITAMIN ADULT PO), Take 1 capsule by mouth 3 (three) times a week., Disp: , Rfl:    naproxen sodium (ANAPROX) 220 MG tablet, Take 220 mg by mouth daily as needed., Disp: , Rfl:     omeprazole (PRILOSEC) 20 MG capsule, TAKE ONE CAPSULE BY MOUTH DAILY, Disp: 30 capsule, Rfl: 5   rosuvastatin (CRESTOR) 10 MG tablet, Take 10 mg by mouth daily., Disp: , Rfl:   Social History: Social History   Tobacco Use   Smoking status: Former    Current packs/day: 0.00    Types: Cigarettes    Quit date: 10/30/1995    Years since quitting: 27.4   Smokeless tobacco: Never  Vaping Use   Vaping status: Never Used  Substance Use Topics   Alcohol use: Yes    Alcohol/week: 0.0 standard drinks of alcohol    Comment: couple glasses of wine/ 4 beers every few days   Drug use: No    Family Medical History: Family History  Problem Relation Age of Onset   Lung cancer Mother    Heart failure Father    Breast cancer Sister    Breast cancer Maternal Grandmother     Physical Examination: There were no vitals filed for this visit.  General: Patient is in no apparent distress. Attention to examination is appropriate.  Neck:   Supple.  Full range of motion.  Respiratory: Patient is breathing without any difficulty.   NEUROLOGICAL:     Awake, alert, oriented to person, place, and time.  Speech is clear and fluent.   Cranial Nerves: Pupils equal round and reactive to light.  Facial tone is symmetric.  Facial sensation is symmetric. Shoulder shrug is symmetric. Tongue protrusion is midline.    Strength: Side Biceps Triceps Deltoid Interossei Grip Wrist Ext. Wrist Flex.  R 5 5 5 5 5 5 5   L 5 5 5 5 5 5 5    Side Iliopsoas Quads Hamstring PF DF EHL  R 5 5 5 5 5 5   L 5 5 5 4 4  4-   Reflexes are absent at the left Achilles, 3+ at the bilateral patella with crossed adductor reflexes.  3+ at the brachial radialis biceps and triceps.  Bilateral Hoffmann left worse than right.  No evidence of clonus.  Toes are equivocal.  Decreased sensation in the left L5 and S1 dermatomes     No evidence of dysmetria noted.  Gait is normal.    Imaging: MRI LUMBAR SPINE WITHOUT CONTRAST    INDICATION: Low back pain, progressive neurologic deficit, M54.42 Lumbago  with sciatica, left side, G89.29 Other chronic pain, M48.061 Spinal  stenosis, lumbar region without neurogenic claudication, M54.16  Radiculopathy, lumbar region, R20.0 Anesthesia of skin, R20.0 Anesthesia of  skin, R26.89 Other abnormalities of gait and mobility   COMPARISON: None   TECHNIQUE/PROTOCOL: Extradural protocol MRI of the lumbar spine was  performed without contrast administration.   FINDINGS:  Anatomical variants: Conventional spinal numbering  Alignment: Normal.  Conus medullaris: terminates at  approximately L1.  Spinal Cord and Cauda Equina: Visualized portions of the spinal cord is  normal in morphology and signal. Normal appearance of the cauda equina.   Bone marrow signal: No suspicious lesions.  Sacroiliac joints: No significant degenerative changes of the visualized SI  joints  Regional soft tissues: Unremarkable.   T12-L1: Seen only on sagittal views. No significant neural foraminal  stenosis disc height loss.    L1-L2: No spinal canal stenosis or significant disc bulge.  L2-L3: Minimal disc bulge without spinal canal stenosis. No significant  neural foraminal stenosis.  L3-L4: Disc desiccation with mild disc height loss, with mild eccentric  disc bulge and mild spinal canal stenosis. Mild bilateral neural foraminal  stenosis.  L4-L5: Disc desiccation with mild disc height loss with mild disc bulge  with an annular fissure of the lateral aspect of the L4 intervertebral  disc. Ligamentum flavum hypertrophy with mild spinal canal stenosis. Mild  bilateral neural foraminal stenosis.    L5-S1: Disc desiccation with moderate disc height loss. Mild disc bulge,  ligamentum flavum hypertrophy with associated mild spinal canal stenosis.  Moderate neural foraminal stenosis bilaterally.    IMPRESSION:  Multilevel degenerative changes of the lumbar spine, most pronounced at  L4-L5 with  associated mild disc bulge, ligamentum flavum hypertrophy and  mild spinal canal stenosis.              Electronically Reviewed by:  Matilde Haymaker, MD, [facility]  Electronically Reviewed on:  03/03/2023 1:33 PM   I have reviewed the images and concur with the above findings.   Electronically Signed by:  Loreli Slot, MD, [facility]  Electronically Signed on:  03/03/2023 5:21 PM     I have personally reviewed the images and feel that the foraminal stenosis especially at L5-S1 quite significant as there is no T1 fat signal change within the foramen as there is demonstrated on the contralateral side as well as the more cranial levels.  Medical Decision Making/Assessment and Plan: Sarah Donovan is a pleasant 72 y.o. female with symptoms consistent with the progressive L5-S1 radiculopathy with progressive weakness in dorsiflexion and EHL as well as inversion and eversion.  She has a positive straight leg raise.  Decreased reflexes at the Achilles.  MRI demonstrated significant foraminal stenosis at L5-S1 on the left.  She often feels that she catches herself tripping and cannot lift her foot high enough to clear carpets or stairs.  She appears to be symptomatic from a left-sided L5-S1 radiculopathy, her imaging supports this finding, and her physical exam localizes well.  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  We do feel that it would be prudent for her to undergo a cervical MRI prior to any intervention on her lumbar spine as she has had previously known cervical spondylosis that was treated conservatively.  Thank you for involving me in the care of this patient.    Lovenia Kim MD/MSCR Neurosurgery

## 2023-03-29 ENCOUNTER — Ambulatory Visit: Payer: Federal, State, Local not specified - PPO | Admitting: Neurosurgery

## 2023-03-29 ENCOUNTER — Encounter: Payer: Self-pay | Admitting: Neurosurgery

## 2023-03-29 VITALS — BP 138/82 | Ht 65.0 in | Wt 139.0 lb

## 2023-03-29 DIAGNOSIS — M4712 Other spondylosis with myelopathy, cervical region: Secondary | ICD-10-CM

## 2023-03-29 DIAGNOSIS — M5417 Radiculopathy, lumbosacral region: Secondary | ICD-10-CM | POA: Diagnosis not present

## 2023-03-29 DIAGNOSIS — M5410 Radiculopathy, site unspecified: Secondary | ICD-10-CM | POA: Insufficient documentation

## 2023-03-29 DIAGNOSIS — M21372 Foot drop, left foot: Secondary | ICD-10-CM | POA: Diagnosis not present

## 2023-03-29 DIAGNOSIS — M4807 Spinal stenosis, lumbosacral region: Secondary | ICD-10-CM | POA: Diagnosis not present

## 2023-04-08 ENCOUNTER — Ambulatory Visit
Admission: RE | Admit: 2023-04-08 | Discharge: 2023-04-08 | Disposition: A | Discharge status: Home / Self Care | Payer: Federal, State, Local not specified - PPO | Source: Ambulatory Visit | Attending: Neurosurgery | Admitting: Neurosurgery

## 2023-04-08 DIAGNOSIS — M4712 Other spondylosis with myelopathy, cervical region: Secondary | ICD-10-CM | POA: Insufficient documentation

## 2023-04-08 DIAGNOSIS — M21372 Foot drop, left foot: Secondary | ICD-10-CM | POA: Diagnosis present

## 2023-04-15 ENCOUNTER — Ambulatory Visit (INDEPENDENT_AMBULATORY_CARE_PROVIDER_SITE_OTHER): Payer: Federal, State, Local not specified - PPO | Admitting: Neurosurgery

## 2023-04-15 DIAGNOSIS — M4302 Spondylolysis, cervical region: Secondary | ICD-10-CM | POA: Diagnosis not present

## 2023-04-15 DIAGNOSIS — M21372 Foot drop, left foot: Secondary | ICD-10-CM

## 2023-04-15 DIAGNOSIS — M5410 Radiculopathy, site unspecified: Secondary | ICD-10-CM

## 2023-04-15 DIAGNOSIS — M5136 Other intervertebral disc degeneration, lumbar region: Secondary | ICD-10-CM

## 2023-04-15 NOTE — Progress Notes (Signed)
Had a follow-up phone call today with Ms. Golz.  She was at home and I was in the office.  She gave consent to go forward with a phone visit to discuss her MRI results and plan for moving forward.  She states that she continues to have left sided foot pain causing severe pain radiating to top of her foot associated with a left-sided foot drop and foot weakness.  She states that she often catches her self catching her foot and has near falls.  This has not gotten any better.  She has had conservative therapy.  Her weakness is severe as outlined in our previous evaluation.  We noticed some hyperreflexia on her examination and given her history of previous cervicalgia ordered an MRI of her cervical spine to ensure that she did not have any significant stenosis.  She does have some mild spondylosis but no significant central canal stenosis or compression of her central cord.  Given her weakness in her left lower extremity and falls with no improvement in her pain with conservative therapy.  Would like to go forward with a left-sided L5-S1 hemilaminectomy discectomy foraminotomies for decompression of her nerve roots.  We did discuss that given the lateral nature of some of her compression that this may not give her a resolution, however she would like to avoid a fusion surgery if possible.  We did discuss that if this would not improve her symptoms that we may do would move forward with this surgery to give indirect decompression or remove the facet in whole.  Will plan to reach out with her for scheduling for her left sided MIS L5-S1 hemilaminectomy discectomy and foraminotomies.  We spent 10 minutes on the phone discussing the plan.

## 2023-04-19 ENCOUNTER — Other Ambulatory Visit: Payer: Self-pay

## 2023-04-19 ENCOUNTER — Telehealth: Payer: Self-pay | Admitting: Neurosurgery

## 2023-04-19 DIAGNOSIS — M5410 Radiculopathy, site unspecified: Secondary | ICD-10-CM

## 2023-04-19 DIAGNOSIS — M21372 Foot drop, left foot: Secondary | ICD-10-CM

## 2023-04-19 DIAGNOSIS — M5136 Other intervertebral disc degeneration, lumbar region: Secondary | ICD-10-CM

## 2023-04-19 DIAGNOSIS — Z01818 Encounter for other preprocedural examination: Secondary | ICD-10-CM

## 2023-04-19 NOTE — Telephone Encounter (Signed)
Dr. Katrinka Blazing spoke to the patient on 9/12, she is waiting to set up surgery. She would like to have it asap. She is staying in town until she can have surgery.

## 2023-04-19 NOTE — Telephone Encounter (Signed)
I spoke with Ms Viles. We discussed the following   Planned surgery: Left L5-S1 hemilaminectomy, discectomy, foraminotomy   Surgery date: 04/29/23 at [facility]: [address], [place], [place] [address]) - you will find out your arrival time the business day before your surgery. *We will plan for 23 hour observation instead of going home the same day because you do not have a driver for the day of surgery*   Pre-op appointment at [facility] Pre-admit Testing: we will call you with a date/time for this. If you are scheduled for an in person appointment, Pre-admit Testing is located on the first floor of the [facility] building, [address]. Please bring all prescriptions in the original prescription bottles to your appointment. During this appointment, they will advise you which medications you can take the morning of surgery, and which medications you will need to hold for surgery. Labs (such as blood work, EKG) may be done at your pre-op appointment. You are not required to fast for these labs. Should you need to change your pre-op appointment, please call Pre-admit testing at [phone].      Common restrictions after surgery: No bending, lifting, or twisting ("BLT"). Avoid lifting objects heavier than 10 pounds for the first 6 weeks after surgery. Where possible, avoid household activities that involve lifting, bending, reaching, pushing, or pulling such as laundry, vacuuming, grocery shopping, and childcare. Try to arrange for help from friends and family for these activities while you heal. Do not drive while taking prescription pain medication. Weeks 6 through 12 after surgery: avoid lifting more than 25 pounds.     How to contact us:  If you have any questions/concerns before or after surgery, you can reach [place] at [phone], or you can send a mychart message. We can be reached by phone or mychart 8am-4pm,  Monday-Friday.  *Please note: Calls after 4pm are forwarded to a third party answering service. Mychart messages are not routinely monitored during evenings, weekends, and holidays. Please call our office to contact the answering service for urgent concerns during non-business hours.     Appointments/FMLA & disability paperwork: Joycelyn Rua, & Flonnie Hailstone Nurse: Royston Cowper  Medical assistants: Laurann Montana, & Lyla Son Physician Assistants: Manning Charity & Drake Leach Surgeons: Venetia Night, MD & Ernestine Mcmurray, MD

## 2023-04-20 ENCOUNTER — Telehealth: Payer: Self-pay

## 2023-04-20 NOTE — Telephone Encounter (Signed)
Sarah Donovan called requesting to reschedule her surgery. She would like to reschedule to 10/1, 10/3 so that her sister can come to town to help her. I informed her that I would need to discuss these options with Dr Katrinka Blazing, as I am not sure if there is enough OR time on either of those dates. I did mention an alternate date of 10/8, which she would like to move to as if 10/1 or 10/3 are not available.

## 2023-04-21 NOTE — Telephone Encounter (Signed)
Per discussion with Dr Katrinka Blazing, I have submitted a request for additional OR time on 05/06/23. We will switch her back to ambulatory instead of 23 hour observation as long her sister is available to take her home that day.

## 2023-04-21 NOTE — Telephone Encounter (Signed)
I spoke with Sarah Donovan. She confirmed that she would like to switch her surgery date to 05/06/23 and that her sister is available to take her home on 05/06/23.

## 2023-04-23 ENCOUNTER — Inpatient Hospital Stay: Admission: RE | Admit: 2023-04-23 | Payer: Federal, State, Local not specified - PPO | Source: Ambulatory Visit

## 2023-04-28 ENCOUNTER — Ambulatory Visit: Payer: Federal, State, Local not specified - PPO | Admitting: Neurosurgery

## 2023-04-28 DIAGNOSIS — M21372 Foot drop, left foot: Secondary | ICD-10-CM

## 2023-04-28 DIAGNOSIS — M5416 Radiculopathy, lumbar region: Secondary | ICD-10-CM

## 2023-04-28 NOTE — Progress Notes (Signed)
Had a follow-up phone call today with Sarah Donovan.  She was at home and I was on the office.  She gave consent to go forward with a phone call.  Her major question for today's phone visit was to discuss her surgery.  She had recently seen the video which discussed spinal surgery and its indications.  She wanted to ensure that the surgery we had planned was for the weakness in her lower extremity and not focused solely on back pain.  We reassured her that her surgery was for a lumbar radiculopathy causing her foot drop and that her main goal for surgery was to decrease the pressure on the nerve that controlled the motor function and pain function to her foot.  All of her questions were answered to her satisfaction.  We spent approximately 10 minutes discussing her care.

## 2023-04-29 ENCOUNTER — Other Ambulatory Visit: Payer: Self-pay

## 2023-04-29 ENCOUNTER — Encounter
Admission: RE | Admit: 2023-04-29 | Discharge: 2023-04-29 | Disposition: A | Discharge status: Home / Self Care | Payer: Federal, State, Local not specified - PPO | Source: Ambulatory Visit | Attending: Neurosurgery | Admitting: Neurosurgery

## 2023-04-29 ENCOUNTER — Encounter: Payer: Self-pay | Admitting: Neurosurgery

## 2023-04-29 VITALS — BP 136/51 | HR 53 | Temp 98.7°F | Resp 20 | Ht 64.0 in | Wt 140.1 lb

## 2023-04-29 DIAGNOSIS — Z01818 Encounter for other preprocedural examination: Secondary | ICD-10-CM

## 2023-04-29 DIAGNOSIS — R7303 Prediabetes: Secondary | ICD-10-CM | POA: Diagnosis not present

## 2023-04-29 DIAGNOSIS — M21372 Foot drop, left foot: Secondary | ICD-10-CM | POA: Diagnosis not present

## 2023-04-29 DIAGNOSIS — Z01812 Encounter for preprocedural laboratory examination: Secondary | ICD-10-CM | POA: Diagnosis not present

## 2023-04-29 DIAGNOSIS — Z01810 Encounter for preprocedural cardiovascular examination: Secondary | ICD-10-CM | POA: Insufficient documentation

## 2023-04-29 DIAGNOSIS — M4712 Other spondylosis with myelopathy, cervical region: Secondary | ICD-10-CM | POA: Insufficient documentation

## 2023-04-29 HISTORY — DX: Hyperlipidemia, unspecified: E78.5

## 2023-04-29 HISTORY — DX: Age-related osteoporosis without current pathological fracture: M81.0

## 2023-04-29 HISTORY — DX: Zoster without complications: B02.9

## 2023-04-29 HISTORY — DX: Other spondylosis with myelopathy, cervical region: M47.12

## 2023-04-29 HISTORY — DX: Foot drop, left foot: M21.372

## 2023-04-29 HISTORY — DX: Depression, unspecified: F32.A

## 2023-04-29 LAB — SURGICAL PCR SCREEN
MRSA, PCR: NEGATIVE
Staphylococcus aureus: NEGATIVE

## 2023-04-29 LAB — TYPE AND SCREEN
ABO/RH(D): A POS
Antibody Screen: NEGATIVE

## 2023-04-29 LAB — URINALYSIS, COMPLETE (UACMP) WITH MICROSCOPIC
Bacteria, UA: NONE SEEN
Bilirubin Urine: NEGATIVE
Glucose, UA: NEGATIVE mg/dL
Hgb urine dipstick: NEGATIVE
Ketones, ur: NEGATIVE mg/dL
Nitrite: NEGATIVE
Protein, ur: NEGATIVE mg/dL
Specific Gravity, Urine: 1.024 (ref 1.005–1.030)
pH: 5 (ref 5.0–8.0)

## 2023-04-29 NOTE — Patient Instructions (Addendum)
Your procedure is scheduled on: 05/06/2023 Thursday  Report to the Registration Desk on the 1st floor of the [facility]. To find out your arrival time, please call [phone] between 1PM - 3PM on:  Wednesday,05/05/2023  If your arrival time is 6:00 am, do not arrive before that time as the [facility] entrance doors do not open until 6:00 am.  REMEMBER: Instructions that are not followed completely may result in serious medical risk, up to and including death; or upon the discretion of your surgeon and anesthesiologist your surgery may need to be rescheduled.  Do not eat food after midnight the night before surgery.  No gum chewing or hard candies.  You may however, drink CLEAR liquids up to 2 hours before you are scheduled to arrive for your surgery. Do not drink anything within 2 hours of your scheduled arrival time.  Clear liquids include: - water  - apple juice without pulp - gatorade (not RED colors) - black coffee or tea (Do NOT add milk or creamers to the coffee or tea) Do NOT drink anything that is not on this list.    One week prior to surgery:  Stop ANY OVER THE COUNTER supplements until after surgery. You may however, continue to take Tylenol if needed for pain up until the day of surgery.  Continue taking all prescribed medications.    TAKE ONLY THESE MEDICATIONS THE MORNING OF SURGERY WITH A SIP OF WATER:  escitalopram (LEXAPRO)  omeprazole (PRILOSEC OTC)  (take one the night before and one on the morning of surgery - helps to prevent nausea after surgery.)  No Alcohol for 24 hours before or after surgery.  No Smoking including e-cigarettes for 24 hours before surgery.  No chewable tobacco products for at least 6 hours before surgery.  No nicotine patches on the day of surgery.  Do not use any "recreational" drugs for at least a week (preferably 2 weeks) before your surgery.  Please be advised that the combination of cocaine and anesthesia may have  negative outcomes, up to and including death. If you test positive for cocaine, your surgery will be cancelled.  On the morning of surgery brush your teeth with toothpaste and water, you may rinse your mouth with mouthwash if you wish. Do not swallow any toothpaste or mouthwash.  Use CHG Soap  as directed on instruction sheet.  Do not wear jewelry, make-up, hairpins, clips or nail polish.  For welded (permanent) jewelry: bracelets, anklets, waist bands, etc.  Please have this removed prior to surgery.  If it is not removed, there is a chance that hospital personnel will need to cut it off on the day of surgery.  Do not wear lotions, powders, or perfumes.   Do not shave body hair from the neck down 48 hours before surgery.  Contact lenses, hearing aids and dentures may not be worn into surgery.  Do not bring valuables to the hospital. [facility] is not responsible for any missing/lost belongings or valuables.    Bring your C-PAP to the hospital in case you may have to spend the night.   Notify your doctor if there is any change in your medical condition (cold, fever, infection).  Wear comfortable clothing (specific to your surgery type) to the hospital.  After surgery, you can help prevent lung complications by doing breathing exercises.  Take deep breaths and cough every 1-2 hours. Your doctor may order a device called an Incentive Spirometer to help you take deep breaths.  If you are being admitted to the hospital overnight, leave your suitcase in the car. After surgery it may be brought to your room.  If you are being discharged the day of surgery, you will not be allowed to drive home. You will need a responsible individual to drive you home and stay with you for 24 hours after surgery.   Please call the Pre-admissions Testing Dept. at [phone] if you have any questions about these instructions.  Surgery Visitation Policy:  Patients having surgery or a procedure may  have two visitors.  Children under the age of 38 must have an adult with them who is not the patient.  Inpatient Visitation:    Visiting hours are 7 a.m. to 8 p.m. Up to four visitors are allowed at one time in a patient room. The visitors may rotate out with other people during the day.  One visitor age 28 or older may stay with the patient overnight and must be in the room by 8 p.m.        Preparing for Surgery with CHLORHEXIDINE GLUCONATE (CHG) Soap  Chlorhexidine Gluconate (CHG) Soap  o An antiseptic cleaner that kills germs and bonds with the skin to continue killing germs even after washing  o Used for showering the night before surgery and morning of surgery  Before surgery, you can play an important role by reducing the number of germs on your skin.  CHG (Chlorhexidine gluconate) soap is an antiseptic cleanser which kills germs and bonds with the skin to continue killing germs even after washing.  Please do not use if you have an allergy to CHG or antibacterial soaps. If your skin becomes reddened/irritated stop using the CHG.  1. Shower the NIGHT BEFORE SURGERY and the MORNING OF SURGERY with CHG soap.  2. If you choose to wash your hair, wash your hair first as usual with your normal shampoo.  3. After shampooing, rinse your hair and body thoroughly to remove the shampoo.  4. Use CHG as you would any other liquid soap. You can apply CHG directly to the skin and wash gently with a scrungie or a clean washcloth.  5. Apply the CHG soap to your body only from the neck down. Do not use on open wounds or open sores. Avoid contact with your eyes, ears, mouth, and genitals (private parts). Wash face and genitals (private parts) with your normal soap.  6. Wash thoroughly, paying special attention to the area where your surgery will be performed.  7. Thoroughly rinse your body with warm water.  8. Do not shower/wash with your normal soap after using and rinsing off the CHG  soap.  9. Pat yourself dry with a clean towel.  10. Wear clean pajamas to bed the night before surgery.  12. Place clean sheets on your bed the night of your first shower and do not sleep with pets.  13. Shower again with the CHG soap on the day of surgery prior to arriving at the hospital.  14. Do not apply any deodorants/lotions/powders.  15. Please wear clean clothes to the hospital.      Pre-operative 5 CHG Bath Instructions   You can play a key role in reducing the risk of infection after surgery. Your skin needs to be as free of germs as possible. You can reduce the number of germs on your skin by washing with CHG (chlorhexidine gluconate) soap before surgery. CHG is an antiseptic soap that kills germs and continues to kill  germs even after washing.   DO NOT use if you have an allergy to chlorhexidine/CHG or antibacterial soaps. If your skin becomes reddened or irritated, stop using the CHG and notify one of our RNs at [phone].   Please shower with the CHG soap starting 4 days before surgery using the following schedule:     Please keep in mind the following:  DO NOT shave, including legs and underarms, starting the day of your first shower.   You may shave your face at any point before/day of surgery.  Place clean sheets on your bed the day you start using CHG soap. Use a clean washcloth (not used since being washed) for each shower. DO NOT sleep with pets once you start using the CHG.   CHG Shower Instructions:  If you choose to wash your hair and private area, wash first with your normal shampoo/soap.  After you use shampoo/soap, rinse your hair and body thoroughly to remove shampoo/soap residue.  Turn the water OFF and apply about 3 tablespoons (45 ml) of CHG soap to a CLEAN washcloth.  Apply CHG soap ONLY FROM YOUR NECK DOWN TO YOUR TOES (washing for 3-5 minutes)  DO NOT use CHG soap on face, private areas, open wounds, or sores.  Pay special attention to the  area where your surgery is being performed.  If you are having back surgery, having someone wash your back for you may be helpful. Wait 2 minutes after CHG soap is applied, then you may rinse off the CHG soap.  Pat dry with a clean towel  Put on clean clothes/pajamas   If you choose to wear lotion, please use ONLY the CHG-compatible lotions on the back of this paper.     Additional instructions for the day of surgery: DO NOT APPLY any lotions, deodorants, cologne, or perfumes.   Put on clean/comfortable clothes.  Brush your teeth.  Ask your nurse before applying any prescription medications to the skin.      CHG Compatible Lotions   Aveeno Moisturizing lotion  Cetaphil Moisturizing Cream  Cetaphil Moisturizing Lotion  Clairol Herbal Essence Moisturizing Lotion, Dry Skin  Clairol Herbal Essence Moisturizing Lotion, Extra Dry Skin  Clairol Herbal Essence Moisturizing Lotion, Normal Skin  Curel Age Defying Therapeutic Moisturizing Lotion with Alpha Hydroxy  Curel Extreme Care Body Lotion  Curel Soothing Hands Moisturizing Hand Lotion  Curel Therapeutic Moisturizing Cream, Fragrance-Free  Curel Therapeutic Moisturizing Lotion, Fragrance-Free  Curel Therapeutic Moisturizing Lotion, Original Formula  Eucerin Daily Replenishing Lotion  Eucerin Dry Skin Therapy Plus Alpha Hydroxy Crme  Eucerin Dry Skin Therapy Plus Alpha Hydroxy Lotion  Eucerin Original Crme  Eucerin Original Lotion  Eucerin Plus Crme Eucerin Plus Lotion  Eucerin TriLipid Replenishing Lotion  Keri Anti-Bacterial Hand Lotion  Keri Deep Conditioning Original Lotion Dry Skin Formula Softly Scented  Keri Deep Conditioning Original Lotion, Fragrance Free Sensitive Skin Formula  Keri Lotion Fast Absorbing Fragrance Free Sensitive Skin Formula  Keri Lotion Fast Absorbing Softly Scented Dry Skin Formula  Keri Original Lotion  Keri Skin Renewal Lotion Keri Silky Smooth Lotion  Keri Silky Smooth Sensitive Skin Lotion   Nivea Body Creamy Conditioning Oil  Nivea Body Extra Enriched Teacher, adult education Moisturizing Lotion Nivea Crme  Nivea Skin Firming Lotion  NutraDerm 30 Skin Lotion  NutraDerm Skin Lotion  NutraDerm Therapeutic Skin Cream  NutraDerm Therapeutic Skin Lotion  ProShield Protective Hand Cream  Provon moisturizing lotion

## 2023-05-05 ENCOUNTER — Encounter: Payer: Self-pay | Admitting: Neurosurgery

## 2023-05-06 ENCOUNTER — Encounter
Admission: RE | Disposition: A | Discharge status: Home / Self Care | Payer: Self-pay | Source: Home / Self Care | Attending: Neurosurgery

## 2023-05-06 ENCOUNTER — Ambulatory Visit: Payer: Medicare Other | Admitting: Certified Registered"

## 2023-05-06 ENCOUNTER — Ambulatory Visit: Payer: Medicare Other | Admitting: Urgent Care

## 2023-05-06 ENCOUNTER — Ambulatory Visit: Payer: Medicare Other

## 2023-05-06 ENCOUNTER — Ambulatory Visit
Admission: RE | Admit: 2023-05-06 | Discharge: 2023-05-06 | Disposition: A | Discharge status: Home / Self Care | Payer: Medicare Other | Attending: Neurosurgery | Admitting: Neurosurgery

## 2023-05-06 ENCOUNTER — Encounter: Payer: Self-pay | Admitting: Neurosurgery

## 2023-05-06 DIAGNOSIS — K219 Gastro-esophageal reflux disease without esophagitis: Secondary | ICD-10-CM | POA: Insufficient documentation

## 2023-05-06 DIAGNOSIS — M4726 Other spondylosis with radiculopathy, lumbar region: Secondary | ICD-10-CM | POA: Insufficient documentation

## 2023-05-06 DIAGNOSIS — M5116 Intervertebral disc disorders with radiculopathy, lumbar region: Secondary | ICD-10-CM | POA: Insufficient documentation

## 2023-05-06 DIAGNOSIS — F0390 Unspecified dementia without behavioral disturbance: Secondary | ICD-10-CM | POA: Insufficient documentation

## 2023-05-06 DIAGNOSIS — M5416 Radiculopathy, lumbar region: Secondary | ICD-10-CM | POA: Diagnosis not present

## 2023-05-06 DIAGNOSIS — M48061 Spinal stenosis, lumbar region without neurogenic claudication: Secondary | ICD-10-CM | POA: Insufficient documentation

## 2023-05-06 DIAGNOSIS — F32A Depression, unspecified: Secondary | ICD-10-CM | POA: Insufficient documentation

## 2023-05-06 DIAGNOSIS — Z87891 Personal history of nicotine dependence: Secondary | ICD-10-CM | POA: Insufficient documentation

## 2023-05-06 DIAGNOSIS — M21372 Foot drop, left foot: Secondary | ICD-10-CM | POA: Diagnosis not present

## 2023-05-06 DIAGNOSIS — F419 Anxiety disorder, unspecified: Secondary | ICD-10-CM | POA: Insufficient documentation

## 2023-05-06 DIAGNOSIS — G4733 Obstructive sleep apnea (adult) (pediatric): Secondary | ICD-10-CM | POA: Diagnosis not present

## 2023-05-06 DIAGNOSIS — I10 Essential (primary) hypertension: Secondary | ICD-10-CM | POA: Insufficient documentation

## 2023-05-06 DIAGNOSIS — M5410 Radiculopathy, site unspecified: Secondary | ICD-10-CM

## 2023-05-06 DIAGNOSIS — Z01818 Encounter for other preprocedural examination: Secondary | ICD-10-CM

## 2023-05-06 DIAGNOSIS — M4712 Other spondylosis with myelopathy, cervical region: Secondary | ICD-10-CM

## 2023-05-06 HISTORY — PX: LUMBAR LAMINECTOMY/DECOMPRESSION MICRODISCECTOMY: SHX5026

## 2023-05-06 LAB — ABO/RH: ABO/RH(D): A POS

## 2023-05-06 SURGERY — LUMBAR LAMINECTOMY/DECOMPRESSION MICRODISCECTOMY 1 LEVEL
Anesthesia: General | Site: Back | Laterality: Left

## 2023-05-06 MED ORDER — BUPIVACAINE HCL (PF) 0.5 % IJ SOLN
INTRAMUSCULAR | Status: AC
  Filled 2023-05-06: qty 30

## 2023-05-06 MED ORDER — ONDANSETRON HCL 4 MG/2ML IJ SOLN: 4.0000 mg | Freq: Once | INTRAMUSCULAR | Status: DC | PRN

## 2023-05-06 MED ORDER — DEXAMETHASONE SODIUM PHOSPHATE 10 MG/ML IJ SOLN
INTRAMUSCULAR | Status: AC
  Filled 2023-05-06: qty 1

## 2023-05-06 MED ORDER — SODIUM CHLORIDE (PF) 0.9 % IJ SOLN
INTRAMUSCULAR | Status: DC | PRN
  Administered 2023-05-06: 30 mL

## 2023-05-06 MED ORDER — KETAMINE HCL 50 MG/5ML IJ SOSY
PREFILLED_SYRINGE | INTRAMUSCULAR | Status: AC
  Filled 2023-05-06: qty 5

## 2023-05-06 MED ORDER — OXYCODONE HCL 5 MG/5ML PO SOLN: 5.0000 mg | Freq: Once | ORAL | Status: DC | PRN

## 2023-05-06 MED ORDER — BUPIVACAINE LIPOSOME 1.3 % IJ SUSP
INTRAMUSCULAR | Status: AC
  Filled 2023-05-06: qty 20

## 2023-05-06 MED ORDER — DEXAMETHASONE SODIUM PHOSPHATE 10 MG/ML IJ SOLN
INTRAMUSCULAR | Status: DC | PRN
  Administered 2023-05-06: 10 mg via INTRAVENOUS

## 2023-05-06 MED ORDER — METHYLPREDNISOLONE ACETATE 40 MG/ML IJ SUSP
INTRAMUSCULAR | Status: AC
  Filled 2023-05-06: qty 1

## 2023-05-06 MED ORDER — BUPIVACAINE-EPINEPHRINE (PF) 0.5% -1:200000 IJ SOLN
INTRAMUSCULAR | Status: AC
  Filled 2023-05-06: qty 10

## 2023-05-06 MED ORDER — OXYCODONE HCL 5 MG PO TABS: 5.0000 mg | ORAL_TABLET | ORAL | 0 refills | Status: DC | PRN

## 2023-05-06 MED ORDER — KETAMINE HCL 50 MG/5ML IJ SOSY
PREFILLED_SYRINGE | INTRAMUSCULAR | Status: DC | PRN
Start: 2023-05-06 — End: 2023-05-06
  Administered 2023-05-06: 20 mg via INTRAVENOUS

## 2023-05-06 MED ORDER — ONDANSETRON HCL 4 MG/2ML IJ SOLN
INTRAMUSCULAR | Status: DC | PRN
  Administered 2023-05-06: 4 mg via INTRAVENOUS

## 2023-05-06 MED ORDER — CEFAZOLIN SODIUM-DEXTROSE 2-4 GM/100ML-% IV SOLN
INTRAVENOUS | Status: AC
  Filled 2023-05-06: qty 100

## 2023-05-06 MED ORDER — OXYCODONE HCL 5 MG PO TABS: 5.0000 mg | ORAL_TABLET | Freq: Once | ORAL | Status: DC | PRN

## 2023-05-06 MED ORDER — SUGAMMADEX SODIUM 200 MG/2ML IV SOLN
INTRAVENOUS | Status: DC | PRN
  Administered 2023-05-06: 140 mg via INTRAVENOUS

## 2023-05-06 MED ORDER — ORAL CARE MOUTH RINSE: 15.0000 mL | Freq: Once | OROMUCOSAL | Status: AC

## 2023-05-06 MED ORDER — SURGIFLO WITH THROMBIN (HEMOSTATIC MATRIX KIT) OPTIME
TOPICAL | Status: DC | PRN
  Administered 2023-05-06: 1 via TOPICAL

## 2023-05-06 MED ORDER — BUPIVACAINE-EPINEPHRINE (PF) 0.5% -1:200000 IJ SOLN
INTRAMUSCULAR | Status: DC | PRN
  Administered 2023-05-06: 7 mL via PERINEURAL

## 2023-05-06 MED ORDER — 0.9 % SODIUM CHLORIDE (POUR BTL) OPTIME
TOPICAL | Status: DC | PRN
  Administered 2023-05-06: 500 mL

## 2023-05-06 MED ORDER — PROPOFOL 10 MG/ML IV BOLUS
INTRAVENOUS | Status: DC | PRN
  Administered 2023-05-06: 80 mg via INTRAVENOUS

## 2023-05-06 MED ORDER — MIDAZOLAM HCL 2 MG/2ML IJ SOLN
INTRAMUSCULAR | Status: AC
  Filled 2023-05-06: qty 2

## 2023-05-06 MED ORDER — LACTATED RINGERS IV SOLN: INTRAVENOUS | Status: DC

## 2023-05-06 MED ORDER — ROCURONIUM BROMIDE 100 MG/10ML IV SOLN
INTRAVENOUS | Status: DC | PRN
  Administered 2023-05-06: 20 mg via INTRAVENOUS
  Administered 2023-05-06: 50 mg via INTRAVENOUS

## 2023-05-06 MED ORDER — CHLORHEXIDINE GLUCONATE 0.12 % MT SOLN
OROMUCOSAL | Status: AC
  Filled 2023-05-06: qty 15

## 2023-05-06 MED ORDER — ACETAMINOPHEN 10 MG/ML IV SOLN
INTRAVENOUS | Status: DC | PRN
  Administered 2023-05-06: 1000 mg via INTRAVENOUS

## 2023-05-06 MED ORDER — FENTANYL CITRATE (PF) 100 MCG/2ML IJ SOLN
INTRAMUSCULAR | Status: AC
  Filled 2023-05-06: qty 2

## 2023-05-06 MED ORDER — MIDAZOLAM HCL 2 MG/2ML IJ SOLN
INTRAMUSCULAR | Status: DC | PRN
  Administered 2023-05-06: 2 mg via INTRAVENOUS

## 2023-05-06 MED ORDER — LIDOCAINE HCL (CARDIAC) PF 100 MG/5ML IV SOSY
PREFILLED_SYRINGE | INTRAVENOUS | Status: DC | PRN
  Administered 2023-05-06: 100 mg via INTRAVENOUS

## 2023-05-06 MED ORDER — PHENYLEPHRINE 80 MCG/ML (10ML) SYRINGE FOR IV PUSH (FOR BLOOD PRESSURE SUPPORT)
PREFILLED_SYRINGE | INTRAVENOUS | Status: DC | PRN
  Administered 2023-05-06 (×5): 80 ug via INTRAVENOUS

## 2023-05-06 MED ORDER — SENNA 8.6 MG PO TABS: 1.0000 | ORAL_TABLET | Freq: Every day | ORAL | 0 refills | Status: DC | PRN

## 2023-05-06 MED ORDER — ACETAMINOPHEN 10 MG/ML IV SOLN
INTRAVENOUS | Status: AC
  Filled 2023-05-06: qty 100

## 2023-05-06 MED ORDER — ROCURONIUM BROMIDE 10 MG/ML (PF) SYRINGE
PREFILLED_SYRINGE | INTRAVENOUS | Status: AC
  Filled 2023-05-06: qty 10

## 2023-05-06 MED ORDER — FENTANYL CITRATE (PF) 100 MCG/2ML IJ SOLN: 25.0000 ug | INTRAMUSCULAR | Status: DC | PRN

## 2023-05-06 MED ORDER — ACETAMINOPHEN 10 MG/ML IV SOLN: 1000.0000 mg | Freq: Once | INTRAVENOUS | Status: DC | PRN

## 2023-05-06 MED ORDER — PROPOFOL 10 MG/ML IV BOLUS
INTRAVENOUS | Status: AC
  Filled 2023-05-06: qty 20

## 2023-05-06 MED ORDER — CEFAZOLIN IN SODIUM CHLORIDE 2-0.9 GM/100ML-% IV SOLN
2.0000 g | Freq: Once | INTRAVENOUS | Status: AC
  Administered 2023-05-06: 2 g via INTRAVENOUS
  Filled 2023-05-06: qty 100

## 2023-05-06 MED ORDER — PHENYLEPHRINE 80 MCG/ML (10ML) SYRINGE FOR IV PUSH (FOR BLOOD PRESSURE SUPPORT)
PREFILLED_SYRINGE | INTRAVENOUS | Status: AC
  Filled 2023-05-06: qty 10

## 2023-05-06 MED ORDER — METHOCARBAMOL 500 MG PO TABS: 500.0000 mg | ORAL_TABLET | Freq: Four times a day (QID) | ORAL | 0 refills | Status: AC

## 2023-05-06 MED ORDER — GLYCOPYRROLATE 0.2 MG/ML IJ SOLN
INTRAMUSCULAR | Status: AC
  Filled 2023-05-06: qty 1

## 2023-05-06 MED ORDER — FENTANYL CITRATE (PF) 100 MCG/2ML IJ SOLN
INTRAMUSCULAR | Status: DC | PRN
  Administered 2023-05-06 (×2): 25 ug via INTRAVENOUS

## 2023-05-06 MED ORDER — GLYCOPYRROLATE 0.2 MG/ML IJ SOLN
INTRAMUSCULAR | Status: DC | PRN
  Administered 2023-05-06: .2 mg via INTRAVENOUS

## 2023-05-06 MED ORDER — SODIUM CHLORIDE FLUSH 0.9 % IV SOLN
INTRAVENOUS | Status: AC
  Filled 2023-05-06: qty 10

## 2023-05-06 MED ORDER — METHOCARBAMOL 500 MG PO TABS: 500.0000 mg | ORAL_TABLET | Freq: Four times a day (QID) | ORAL | 0 refills | Status: DC

## 2023-05-06 MED ORDER — LIDOCAINE HCL (PF) 2 % IJ SOLN
INTRAMUSCULAR | Status: AC
  Filled 2023-05-06: qty 5

## 2023-05-06 MED ORDER — CHLORHEXIDINE GLUCONATE 0.12 % MT SOLN
15.0000 mL | Freq: Once | OROMUCOSAL | Status: AC
  Administered 2023-05-06: 15 mL via OROMUCOSAL

## 2023-05-06 MED ORDER — OXYCODONE HCL 5 MG PO TABS
5.0000 mg | ORAL_TABLET | ORAL | 0 refills | Status: AC | PRN
Start: 2023-05-06 — End: ?

## 2023-05-06 MED ORDER — SENNA 8.6 MG PO TABS
1.0000 | ORAL_TABLET | Freq: Every day | ORAL | 0 refills | Status: AC | PRN
Start: 2023-05-06 — End: ?

## 2023-05-06 SURGICAL SUPPLY — 43 items
ADH SKN CLS APL DERMABOND .7 (GAUZE/BANDAGES/DRESSINGS) ×1
AGENT HMST KT MTR STRL THRMB (HEMOSTASIS) ×1
BASIN KIT SINGLE STR (MISCELLANEOUS) ×1 IMPLANT
BRUSH SCRUB EZ 4% CHG (MISCELLANEOUS) ×1 IMPLANT
BUR NEURO DRILL SOFT 3.0X3.8M (BURR) ×1 IMPLANT
DERMABOND ADVANCED .7 DNX12 (GAUZE/BANDAGES/DRESSINGS) ×1 IMPLANT
DRAPE C-ARM XRAY 36X54 (DRAPES) IMPLANT
DRAPE LAPAROTOMY 100X77 ABD (DRAPES) ×1 IMPLANT
DRAPE MICROSCOPE SPINE 48X150 (DRAPES) ×1 IMPLANT
DRSG OPSITE POSTOP 3X4 (GAUZE/BANDAGES/DRESSINGS) ×1 IMPLANT
DRSG TEGADERM 4X4.75 (GAUZE/BANDAGES/DRESSINGS) ×1 IMPLANT
ELECT EZSTD 165MM 6.5IN (MISCELLANEOUS) ×1
ELECTRODE EZSTD 165MM 6.5IN (MISCELLANEOUS) ×1 IMPLANT
GLOVE BIOGEL PI IND STRL 8 (GLOVE) ×1 IMPLANT
GLOVE SURG SYN 7.5 E (GLOVE) ×1 IMPLANT
GLOVE SURG SYN 7.5 PF PI (GLOVE) ×1 IMPLANT
GOWN SRG XL LVL 3 NONREINFORCE (GOWNS) IMPLANT
GOWN STRL NON-REIN TWL XL LVL3 (GOWNS) ×1
GOWN STRL REUS W/ TWL XL LVL3 (GOWN DISPOSABLE) ×1 IMPLANT
GOWN STRL REUS W/TWL XL LVL3 (GOWN DISPOSABLE) ×1
KIT WILSON FRAME (KITS) ×1 IMPLANT
KNIFE BAYONET SHORT DISCETOMY (MISCELLANEOUS) IMPLANT
KNIFE BOYONETTED ANNULOTOMY (MISCELLANEOUS) IMPLANT
NDL SAFETY ECLIP 18X1.5 (MISCELLANEOUS) IMPLANT
NS IRRIG 1000ML POUR BTL (IV SOLUTION) ×1 IMPLANT
NS IRRIG 500ML POUR BTL (IV SOLUTION) IMPLANT
PACK LAMINECTOMY ARMC (PACKS) ×1 IMPLANT
PAD ARMBOARD 7.5X6 YLW CONV (MISCELLANEOUS) ×2 IMPLANT
SURGIFLO W/THROMBIN 8M KIT (HEMOSTASIS) ×1 IMPLANT
SUT ETHILON 3-0 (SUTURE) ×1 IMPLANT
SUT MNCRL 4-0 (SUTURE) ×1
SUT MNCRL 4-0 27 PS-2 XMFL (SUTURE) ×1
SUT MNCRL 4-0 27XMFL (SUTURE) ×1
SUT STRATA 3-0 15 PS-2 (SUTURE) IMPLANT
SUT VIC AB 0 CT1 27 (SUTURE) ×1
SUT VIC AB 0 CT1 27XCR 8 STRN (SUTURE) ×1 IMPLANT
SUT VIC AB 2-0 CT1 18 (SUTURE) ×1 IMPLANT
SUTURE MNCRL 4-0 27XMF (SUTURE) ×1 IMPLANT
SYR 10ML LL (SYRINGE) ×1 IMPLANT
SYR 30ML LL (SYRINGE) ×1 IMPLANT
SYR 3ML LL SCALE MARK (SYRINGE) ×1 IMPLANT
TRAP FLUID SMOKE EVACUATOR (MISCELLANEOUS) ×1 IMPLANT
WATER STERILE IRR 1000ML POUR (IV SOLUTION) ×1 IMPLANT

## 2023-05-06 NOTE — H&P (Addendum)
Referring Physician:  No referring provider defined for this encounter.  Primary Physician:  Alm Bustard, NP  History of Present Illness: 05/06/2023 Sarah Donovan is here today with a chief complaint of left lower extremity pain and weakness.  She states that she has had this weakness for multiple months.  But she also gets sharp shooting pains that go from her buttocks and back down to the anterior lower extremity to the top of her foot.  She states is sharp and stinging.  She also states that she has had worsened sensation.  Notably she has had history of cervical spondylosis.  She has felt that she has been having worsening ability to ambulate.  Feels like her coordination has been off.  She has not noticed significant loss of function in her hands.  She does feel that her pain is improved with rest and worsens with activity.  Conservative measures:  Physical therapy: yes in the past last time was 5 years ago. Aquatic therapy Multimodal medical therapy including regular antiinflammatories: Gabapentin, Naproxen   Injections: no epidural steroid injections  Past Surgery: no  The symptoms are causing a significant impact on the patient's life.   I have utilized the care everywhere function in epic to review the outside records available from external health systems.  Review of Systems:  A 10 point review of systems is negative, except for the pertinent positives and negatives detailed in the HPI.  Past Medical History: Past Medical History:  Diagnosis Date   Allergic rhinitis    Anxiety    Degenerative cervical disc    Dementia (HCC)    Depression    GERD (gastroesophageal reflux disease)    Herpes zoster    Hyperlipemia    Hypertension    Left foot drop    Obstructive sleep apnea    Currently on CPAP   Osteoporosis    Pre-diabetes    Sciatica of left side    had it a long time per patient   Spinal stenosis    Spondylosis, cervical, with myelopathy     Past  Surgical History: Past Surgical History:  Procedure Laterality Date   BREAST BIOPSY Right 05/21/2022   [place] bx, ribbon marker, path pending   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   BREAST LUMPECTOMY,RADIO FREQ LOCALIZER,AXILLARY SENTINEL LYMPH NODE BIOPSY Right 07/01/2022   Procedure: BREAST LUMPECTOMY,RADIO FREQ LOCALIZER,AXILLARY SENTINEL LYMPH NODE BIOPSY;  Surgeon: Campbell Lerner, MD;  Location: [facility] ORS;  Service: General;  Laterality: Right;   CARDIAC CATHETERIZATION     pt denies vein stripping due to DVT dye to foot   colonoscopy     PARTIAL HYSTERECTOMY     VEIN LIGATION AND STRIPPING Left    left leg    Allergies: Allergies as of 04/19/2023 - Review Complete 03/29/2023  Allergen Reaction Noted   Cynara scolymus (artichoke) Shortness Of Breath 06/01/2022   Penicillins Shortness Of Breath 10/30/2015   Aspirin Other (See Comments) 04/09/2016    Medications:  Current Facility-Administered Medications:    ceFAZolin (ANCEF) IVPB 2g/100 mL premix, 2 g, Intravenous, Once, Lovenia Kim, MD   lactated ringers infusion, , Intravenous, Continuous, Piscitello, Cleda Mccreedy, MD, Last Rate: 10 mL/hr at 05/06/23 0844, New Bag at 05/06/23 0844  Social History: Social History   Tobacco Use   Smoking status: Former    Current packs/day: 0.00    Types: Cigarettes    Quit date: 10/30/1995    Years since quitting: 27.5  Smokeless tobacco: Never  Vaping Use   Vaping status: Never Used  Substance Use Topics   Alcohol use: Yes    Alcohol/week: 0.0 standard drinks of alcohol    Comment: couple glasses of wine/ 4 beers every few days   Drug use: No    Family Medical History: Family History  Problem Relation Age of Onset   Lung cancer Mother    Heart failure Father    Breast cancer Sister    Breast cancer Maternal Grandmother     Physical Examination: Vitals:   05/06/23 0820 05/06/23 0902  BP: (!) 197/75 (!) 188/74  Pulse: 67   Resp: 16   Temp: (!) 97 F (36.1 C)    SpO2: 98%     General: Patient is in no apparent distress. Attention to examination is appropriate.  Neck:   Supple.  Full range of motion.  Respiratory: Patient is breathing without any difficulty. Lung sounds without frank abnoramlity  Cardiac: Heart sounds present without frank abnormality.    NEUROLOGICAL:     Awake, alert, oriented to person, place, and time.  Speech is clear and fluent.   Cranial Nerves: Pupils equal round and reactive to light.  Facial tone is symmetric.  Facial sensation is symmetric. Shoulder shrug is symmetric. Tongue protrusion is midline.    Strength: Side Biceps Triceps Deltoid Interossei Grip Wrist Ext. Wrist Flex.  R 5 5 5 5 5 5 5   L 5 5 5 5 5 5 5    Side Iliopsoas Quads Hamstring PF DF EHL  R 5 5 5 5 5 5   L 5 5 5 4 4  4-   Reflexes are absent at the left Achilles, 3+ at the bilateral patella with crossed adductor reflexes.  3+ at the brachial radialis biceps and triceps.  Bilateral Hoffmann left worse than right.  No evidence of clonus.  Toes are equivocal.  Decreased sensation in the left L5 and S1 dermatomes     No evidence of dysmetria noted.  Gait is normal.    Imaging: MRI LUMBAR SPINE WITHOUT CONTRAST   INDICATION: Low back pain, progressive neurologic deficit, M54.42 Lumbago  with sciatica, left side, G89.29 Other chronic pain, M48.061 Spinal  stenosis, lumbar region without neurogenic claudication, M54.16  Radiculopathy, lumbar region, R20.0 Anesthesia of skin, R20.0 Anesthesia of  skin, R26.89 Other abnormalities of gait and mobility   COMPARISON: None   TECHNIQUE/PROTOCOL: Extradural protocol MRI of the lumbar spine was  performed without contrast administration.   FINDINGS:  Anatomical variants: Conventional spinal numbering  Alignment: Normal.  Conus medullaris: terminates at approximately L1.  Spinal Cord and Cauda Equina: Visualized portions of the spinal cord is  normal in morphology and signal. Normal appearance of  the cauda equina.   Bone marrow signal: No suspicious lesions.  Sacroiliac joints: No significant degenerative changes of the visualized SI  joints  Regional soft tissues: Unremarkable.   T12-L1: Seen only on sagittal views. No significant neural foraminal  stenosis disc height loss.    L1-L2: No spinal canal stenosis or significant disc bulge.  L2-L3: Minimal disc bulge without spinal canal stenosis. No significant  neural foraminal stenosis.  L3-L4: Disc desiccation with mild disc height loss, with mild eccentric  disc bulge and mild spinal canal stenosis. Mild bilateral neural foraminal  stenosis.  L4-L5: Disc desiccation with mild disc height loss with mild disc bulge  with an annular fissure of the lateral aspect of the L4 intervertebral  disc. Ligamentum flavum hypertrophy with mild spinal  canal stenosis. Mild  bilateral neural foraminal stenosis.    L5-S1: Disc desiccation with moderate disc height loss. Mild disc bulge,  ligamentum flavum hypertrophy with associated mild spinal canal stenosis.  Moderate neural foraminal stenosis bilaterally.    IMPRESSION:  Multilevel degenerative changes of the lumbar spine, most pronounced at  L4-L5 with associated mild disc bulge, ligamentum flavum hypertrophy and  mild spinal canal stenosis.              Electronically Reviewed by:  Matilde Haymaker, MD, [facility]  Electronically Reviewed on:  03/03/2023 1:33 PM   I have reviewed the images and concur with the above findings.   Electronically Signed by:  Loreli Slot, MD, [facility]  Electronically Signed on:  03/03/2023 5:21 PM     I have personally reviewed the images and feel that the foraminal stenosis especially at L5-S1 quite significant as there is no T1 fat signal change within the foramen as there is demonstrated on the contralateral side as well as the more cranial levels.  Medical Decision Making/Assessment and Plan: Ms. Otterson is a pleasant 71 y.o.  female with symptoms consistent with the progressive L5-S1 radiculopathy with progressive weakness in dorsiflexion and EHL as well as inversion and eversion.  She has a positive straight leg raise.  Decreased reflexes at the Achilles.  MRI demonstrated significant foraminal stenosis at L5-S1 on the left.  She often feels that she catches herself tripping and cannot lift her foot high enough to clear carpets or stairs. She appears to be symptomatic from a left-sided L5-S1 radiculopathy, her imaging supports this finding, and her physical exam localizes well. Given her weakness and ongoing compression will plan for a L5-S1 Microdiscectomy. We did discuss that some of her compression is lateral and this may not give her full relief but she'd like to avoid a fusion if possible.   Thank you for involving me in the care of this patient.    Lovenia Kim MD/MSCR Neurosurgery

## 2023-05-06 NOTE — Transfer of Care (Signed)
Immediate Anesthesia Transfer of Care Note  Patient: Sarah Donovan  Procedure(s) Performed: LEFT MINIMALLY INVASIVE (MIS) L5-S1 HEMILAMINECTOMY, DISCECTOMY, AND FORAMINOTOMY (Left: Back)  Patient Location: PACU  Anesthesia Type:General  Level of Consciousness: awake, alert , and oriented  Airway & Oxygen Therapy: Patient Spontanous Breathing and Patient connected to face mask oxygen  Post-op Assessment: Report given to RN and Post -op Vital signs reviewed and stable  Post vital signs: Reviewed and stable  Last Vitals:  Vitals Value Taken Time  BP 182/72 05/06/23 1112  Temp    Pulse 68 05/06/23 1114  Resp    SpO2 99 % 05/06/23 1114  Vitals shown include unfiled device data.  Last Pain:  Vitals:   05/06/23 0820  TempSrc: Temporal  PainSc: 3          Complications: No notable events documented.

## 2023-05-06 NOTE — Discharge Instructions (Addendum)
Your surgeon has performed an operation on your lumbar spine (low back) to relieve pressure on one or more nerves. Many times, patients feel better immediately after surgery and can "overdo it." Even if you feel well, it is important that you follow these activity guidelines. If you do not let your back heal properly from the surgery, you can increase the chance of a disc herniation and/or return of your symptoms. The following are instructions to help in your recovery once you have been discharged from the hospital.  * It is ok to take NSAIDs after surgery.  Activity    No bending, lifting, or twisting ("BLT"). Avoid lifting objects heavier than 10 pounds (gallon milk jug).  Where possible, avoid household activities that involve lifting, bending, pushing, or pulling such as laundry, vacuuming, grocery shopping, and childcare. Try to arrange for help from friends and family for these activities while your back heals.  Increase physical activity slowly as tolerated.  Taking short walks is encouraged, but avoid strenuous exercise. Do not jog, run, bicycle, lift weights, or participate in any other exercises unless specifically allowed by your doctor. Avoid prolonged sitting, including car rides.  Talk to your doctor before resuming sexual activity.  You should not drive until cleared by your doctor.  Until released by your doctor, you should not return to work or school.  You should rest at home and let your body heal.   You may shower three days after your surgery.  After showering, lightly dab your incision dry. Do not take a tub bath or go swimming for 3 weeks, or until approved by your doctor at your follow-up appointment.  If you smoke, we strongly recommend that you quit.  Smoking has been proven to interfere with normal healing in your back and will dramatically reduce the success rate of your surgery. Please contact [facility] (800-QUIT-NOW) and use the resources at www.[facility].com for  assistance in stopping smoking.  Surgical Incision   If you have a dressing on your incision, you may remove it three days after your surgery. Keep your incision area clean and dry.  If you have staples or stitches on your incision, you should have a follow up scheduled for removal. If you do not have staples or stitches, you will have steri-strips (small pieces of surgical tape) or Dermabond glue. The steri-strips/glue should begin to peel away within about a week (it is fine if the steri-strips fall off before then). If the strips are still in place one week after your surgery, you may gently remove them.  Diet            You may return to your usual diet. Be sure to stay hydrated.  When to Contact us  Although your surgery and recovery will likely be uneventful, you may have some residual numbness, aches, and pains in your back and/or legs. This is normal and should improve in the next few weeks.  However, should you experience any of the following, contact us immediately: New numbness or weakness Pain that is progressively getting worse, and is not relieved by your pain medications or rest Bleeding, redness, swelling, pain, or drainage from surgical incision Chills or flu-like symptoms Fever greater than 101.0 F (38.3 C) Problems with bowel or bladder functions Difficulty breathing or shortness of breath Warmth, tenderness, or swelling in your calf  Contact Information How to contact us:  If you have any questions/concerns before or after surgery, you can reach [place] at [phone], or you can  send a [employer]. We can be reached by phone or mychart 8am-4pm, Monday-Friday.  *Please note: Calls after 4pm are forwarded to a third party answering service. Mychart messages are not routinely monitored during evenings, weekends, and holidays. Please call our office to contact the answering service for urgent concerns during non-business hours.   AMBULATORY SURGERY  DISCHARGE  INSTRUCTIONS   The drugs that you were given will stay in your system until tomorrow so for the next 24 hours you should not:  Drive an automobile Make any legal decisions Drink any alcoholic beverage   You may resume regular meals tomorrow.  Today it is better to start with liquids and gradually work up to solid foods.  You may eat anything you prefer, but it is better to start with liquids, then soup and crackers, and gradually work up to solid foods.   Please notify your doctor immediately if you have any unusual bleeding, trouble breathing, redness and pain at the surgery site, drainage, fever, or pain not relieved by medication.    Additional Instructions: PLEASE LEAVE GREEN/TEAL BRACELET ON FOR 4 DAYS        Please contact your physician with any problems or Same Day Surgery at [phone], Monday through Friday 6 am to 4 pm, or [place] [facility] number at [phone].

## 2023-05-06 NOTE — Discharge Summary (Signed)
Discharge Summary  Patient ID: Sarah Donovan MRN: [id number] DOB/AGE: 02-25-1951 72 y.o.  Admit date: 05/06/2023 Discharge date: 05/06/2023  Admission Diagnoses: Lumbar radiculopathy  Discharge Diagnoses:  Active Problems:   * No active hospital problems. *   Discharged Condition: good  Hospital Course:  Sarah Donovan is a 72 y.o presenting with left-sided lumbar radiculopathy status post left L5-S1 MIS laminotomy and foraminotomy.  Her intraoperative course was uncomplicated.  She was monitored in PACU and discharged home after ambulating, urinating, and tolerating p.o. intake.  She was given prescriptions for oxycodone, Robaxin, and senna.  Consults:  none   Significant Diagnostic Studies: none  Treatments: surgery: as above. Please see separately dictated operative report for further details   Discharge Exam: Blood pressure (!) 188/74, pulse 67, temperature (!) 97 F (36.1 C), temperature source Temporal, resp. rate 16, height 5\' 4"  (1.626 m), weight 63.5 kg, SpO2 98%. CN II-XII grossly intact Side Iliopsoas Quads Hamstring PF DF EHL  R 5 5 5 5 5 5   L 5 5 5 4 4  4-    Incision c/d/I with dermabond and post-op dressing in place   Disposition: Discharge disposition: 01-Home or Self Care        Allergies as of 05/06/2023       Reactions   Cynara Scolymus (artichoke) Shortness Of Breath   Penicillins Shortness Of Breath   Tolerated ancef   Aspirin Other (See Comments)   headache        Medication List     TAKE these medications    acyclovir 400 MG tablet Commonly known as: ZOVIRAX TAKE 1 TABLET BY MOUTH TWICE DAILY   cholecalciferol 25 MCG (1000 UNIT) tablet Commonly known as: VITAMIN D3 Take 1,000 Units by mouth daily. Pt not sure of the amount   COLLAGEN PO Take 1 Dose by mouth daily. Pt says it is a powder and she puts in her coffee   escitalopram 10 MG tablet Commonly known as: LEXAPRO Take 10 mg by mouth daily.   folic acid 1 MG tablet Commonly  known as: FOLVITE Take 1 mg by mouth daily.   ibuprofen 200 MG tablet Commonly known as: ADVIL Take 200 mg by mouth every 6 (six) hours as needed.   MAGNESIUM PO Take 1 tablet by mouth daily.   methocarbamol 500 MG tablet Commonly known as: ROBAXIN Take 1 tablet (500 mg total) by mouth 4 (four) times daily.   MISC NATURAL PRODUCT OP Take 1,000 mg by mouth 2 (two) times daily. Sour sop, graviola   naproxen sodium 220 MG tablet Commonly known as: ALEVE Take 220 mg by mouth daily as needed.   omeprazole 20 MG tablet Commonly known as: PRILOSEC OTC Take 20 mg by mouth daily.   Osphena 60 MG Tabs Generic drug: Ospemifene Take 1 tablet by mouth daily.   oxyCODONE 5 MG immediate release tablet Commonly known as: Roxicodone Take 1 tablet (5 mg total) by mouth every 4 (four) hours as needed for severe pain.   rosuvastatin 10 MG tablet Commonly known as: CRESTOR Take 10 mg by mouth every evening.   senna 8.6 MG Tabs tablet Commonly known as: SENOKOT Take 1 tablet (8.6 mg total) by mouth daily as needed for mild constipation.   VITAMIN B 12 PO Take 1,000 mcg by mouth daily.         Signed: Susanne Borders 05/06/2023, 11:06 AM

## 2023-05-06 NOTE — Op Note (Signed)
Indications: Ms. Sarah Donovan is suffering from lumbar radiculopathy. The patient tried and failed conservative management, prompting surgical intervention.  Findings: Severe ligamentum flavum hypertrophy with compression of the traversing nerve root.  Small disc herniation with ventral compression of the traversing nerve root.  Preoperative Diagnosis: Lumbar radiculopathy (ICD-10 M54.16) Postoperative Diagnosis: same   EBL: 50 ml IVF: see anesthesia record Drains: none Disposition: Extubated and Stable to PACU Complications: none  No foley catheter was placed.   Preoperative Note: Saw the patient in the preoperative area.  She continued to have a significant left foot drop as well as left foot/lower extremity pain.  Consistent with a lumbar radiculopathy without any improvement.  Risks of surgery discussed include: infection, bleeding, stroke, coma, death, paralysis, CSF leak, nerve/spinal cord injury, numbness, tingling, weakness, complex regional pain syndrome, recurrent stenosis and/or disc herniation, vascular injury, development of instability, neck/back pain, need for further surgery, persistent symptoms, development of deformity, and the risks of anesthesia. The patient understood these risks and agreed to proceed.  Operative Note:   1) left L 5/S1 microdiscectomy  The patient was then brought from the preoperative center with intravenous access established.  The patient underwent general anesthesia and endotracheal tube intubation, and was then rotated on the [place] rail top where all pressure points were appropriately padded.  The skin was then thoroughly cleansed.  Perioperative antibiotic prophylaxis was administered.  Sterile prep and drapes were then applied and a timeout was then observed.  C-arm was brought into the field under sterile conditions, and the L 5-S1 disc space identified and marked with an incision on the left 1cm lateral to midline.  Once this was complete a 2  cm incision was opened with the use of a #10 blade knife.  The Metrx tubes were sequentially advanced under lateral fluoroscopy until a 18 x 50 mm Metrx tube was placed over the facet and lamina and secured to the bed.    The microscope was then sterilely brought into the field and muscle creep was hemostased with a bipolar and resected with a pituitary rongeur.  A Bovie extender was then used to expose the spinous process and lamina.  Careful attention was placed to not violate the facet capsule. A 3 mm matchstick drill bit was then used to make a hemi-laminotomy trough until the ligamentum flavum was exposed.  This was extended to the base of the spinous process.  Once this was complete and the underlying ligamentum flavum was visualized, the ligamentum was dissected with an up angle curette and resected with a #2 and #3 mm biting Kerrison.  The laminotomy opening was also expanded in similar fashion and hemostasis was obtained with Surgifoam and a patty as well as bone wax.  The rostral aspect of the caudal level of the lamina was also resected with a #2 biting Kerrison effort to further enhance exposure.  Once the underlying dura was visualized a Penfield 4 was then used to dissect and expose the traversing nerve root.  Once this was identified a nerve root retractor suction was used to mobilize this medially.  The venous plexus was hemostased with Surgifoam and light bipolar use.  A small penfied was then used to make a small annulotomy within the disc space and a small amount of disc space contents were noted to come through the annulus. The pituitary rongeur was used to remove the extruded disc fragments. Once the thecal sac and nerve root were noted to be relaxed and under less tension the  ball-tipped feeler was passed along the foramen distally to ensure no residual compression was noted.    Depo-Medrol was placed along the nerve root.  The area was irrigated. The tube system was then removed under  microscopic visualization and hemostasis was obtained with a bipolar.    The fascial layer was reapproximated with the use of a 0- Vicryl suture.  Subcutaneous tissue layer was reapproximated using 2-0 Vicryl suture.  3-0 monocryl was used on the skin. The skin was then cleansed and Dermabond was used to close the skin opening.  Patient was then rotated back to the preoperative bed awakened from anesthesia and taken to recovery all counts are correct in this case.   I performed the entire procedure with the assistance of Manning Charity PA as an Designer, television/film set. An assistant was required for this procedure due to the complexity.  The assistant provided assistance in tissue manipulation and suction, and was required for the successful and safe performance of the procedure. I performed the critical portions of the procedure.  Lovenia Kim, MD

## 2023-05-06 NOTE — Anesthesia Preprocedure Evaluation (Addendum)
Anesthesia Evaluation  Patient identified by MRN, date of birth, ID band Patient awake    Reviewed: Allergy & Precautions, NPO status , Patient's Chart, lab work & pertinent test results  History of Anesthesia Complications Negative for: history of anesthetic complications  Airway Mallampati: III   Neck ROM: Full    Dental  (+) Missing   Pulmonary sleep apnea and Continuous Positive Airway Pressure Ventilation , former smoker (quit 1997)   Pulmonary exam normal breath sounds clear to auscultation       Cardiovascular hypertension, Normal cardiovascular exam Rhythm:Regular Rate:Normal  ECG 04/29/23: Sinus bradycardia (HR 51) Otherwise normal ECG   Neuro/Psych  PSYCHIATRIC DISORDERS Anxiety Depression   Dementia Chronic pain    GI/Hepatic ,GERD  ,,  Endo/Other  Prediabetes   Renal/GU negative Renal ROS     Musculoskeletal   Abdominal   Peds  Hematology negative hematology ROS (+)   Anesthesia Other Findings   Reproductive/Obstetrics                             Anesthesia Physical Anesthesia Plan  ASA: 2  Anesthesia Plan: General   Post-op Pain Management:    Induction: Intravenous  PONV Risk Score and Plan: 3 and Ondansetron, Dexamethasone and Treatment may vary due to age or medical condition  Airway Management Planned: Oral ETT  Additional Equipment:   Intra-op Plan:   Post-operative Plan: Extubation in OR  Informed Consent: I have reviewed the patients History and Physical, chart, labs and discussed the procedure including the risks, benefits and alternatives for the proposed anesthesia with the patient or authorized representative who has indicated his/her understanding and acceptance.     Dental advisory given  Plan Discussed with: CRNA  Anesthesia Plan Comments: (Patient consented for risks of anesthesia including but not limited to:  - adverse reactions to  medications - damage to eyes, teeth, lips or other oral mucosa - nerve damage due to positioning  - sore throat or hoarseness - damage to heart, brain, nerves, lungs, other parts of body or loss of life  Informed patient about role of CRNA in peri- and intra-operative care.  Patient voiced understanding.)        Anesthesia Quick Evaluation

## 2023-05-06 NOTE — Anesthesia Postprocedure Evaluation (Signed)
Anesthesia Post Note  Patient: Sarah Donovan  Procedure(s) Performed: LEFT MINIMALLY INVASIVE (MIS) L5-S1 HEMILAMINECTOMY, DISCECTOMY, AND FORAMINOTOMY (Left: Back)  Patient location during evaluation: PACU Anesthesia Type: General Level of consciousness: awake and alert, oriented and patient cooperative Pain management: pain level controlled Vital Signs Assessment: post-procedure vital signs reviewed and stable Respiratory status: spontaneous breathing, nonlabored ventilation and respiratory function stable Cardiovascular status: blood pressure returned to baseline and stable Postop Assessment: adequate PO intake Anesthetic complications: no   No notable events documented.   Last Vitals:  Vitals:   05/06/23 1145 05/06/23 1200  BP: (!) 173/77 (!) 175/69  Pulse: 64 62  Resp: 16 16  Temp: 36.5 C 36.9 C  SpO2: 92% 98%    Last Pain:  Vitals:   05/06/23 1200  TempSrc: Temporal  PainSc: 0-No pain                 Reed Breech

## 2023-05-06 NOTE — Anesthesia Procedure Notes (Signed)
Procedure Name: Intubation Date/Time: 05/06/2023 9:47 AM  Performed by: Morene Crocker, CRNAPre-anesthesia Checklist: Patient identified, Patient being monitored, Timeout performed, Emergency Drugs available and Suction available Patient Re-evaluated:Patient Re-evaluated prior to induction Oxygen Delivery Method: Circle system utilized Preoxygenation: Pre-oxygenation with 100% oxygen Induction Type: IV induction Ventilation: Mask ventilation without difficulty Laryngoscope Size: 3 and McGraph Grade View: Grade I Tube type: Oral Tube size: 7.0 mm Number of attempts: 1 Airway Equipment and Method: Stylet Placement Confirmation: ETT inserted through vocal cords under direct vision, positive ETCO2 and breath sounds checked- equal and bilateral Secured at: 21 cm Tube secured with: Tape Dental Injury: Teeth and Oropharynx as per pre-operative assessment  Comments: Smooth atraumatic intubation, no complications noted

## 2023-05-07 ENCOUNTER — Encounter: Payer: Self-pay | Admitting: Neurosurgery

## 2023-05-14 ENCOUNTER — Encounter: Payer: Federal, State, Local not specified - PPO | Admitting: Orthopedic Surgery

## 2023-05-17 NOTE — Progress Notes (Signed)
   Telephone Visit- Progress Note: Referring Physician:  Alm Bustard, NP [address] [place],  [place] [address]  Primary Physician:  Alm Bustard, NP  This visit was performed via telephone.  Patient location: home Provider location: office  I spent a total of 10 minutes non-face-to-face activities for this visit on the date of this encounter including review of current clinical condition and response to treatment.    Patient has given verbal consent to this telephone visits and we reviewed the limitations of a telephone visit. Patient wishes to proceed.    DOS: 05/06/23   Left L5-S1 microdiscectomy  HISTORY OF PRESENT ILLNESS: Sarah Donovan is approximately 2 weeks status post above surgery. Was given oxycodone and robaxin on discharge from the hospital.   She had a left foot drop prior to surgery.   The numbness and pain in her left foot is much better! She feels like her strength may be a little better as well. She has not tripped on her foot since her surgery. She has some mild LBP.   She is not taking the robaxin. She did not pick up the oxycodone. She is taking prn naproxen. Not taking motrin with this.   ROS (Neurologic):  Negative except as noted above   Exam: No exam done as this was a telephone encounter.     Picture of incision sent 05/20/23  IMAGING: Nothing new to review.   ASSESSMENT/PLAN:  Sarah Donovan is doing well s/p above surgery. Treatment options reviewed with patient and following plan made:   - I have advised the patient to lift up to 10 pounds until 6 weeks after surgery (follow up with Dr. Katrinka Blazing).  - Reviewed wound care.  - No bending, twisting, or lifting.  - Continue on current medications including prn naproxen. Not taking motrin with this.   - Follow up as scheduled in 4 weeks and prn.   Of note, she is leaving in a few days to drive to [place]. Discussed she would need to stop frequently. She will be back in [place] in the  spring.   Advised to contact the office if any questions or concerns arise.  Drake Leach PA-C Neurosurgery

## 2023-05-21 ENCOUNTER — Ambulatory Visit (INDEPENDENT_AMBULATORY_CARE_PROVIDER_SITE_OTHER): Payer: Federal, State, Local not specified - PPO | Admitting: Orthopedic Surgery

## 2023-05-21 ENCOUNTER — Encounter: Payer: Self-pay | Admitting: Orthopedic Surgery

## 2023-05-21 DIAGNOSIS — Z9889 Other specified postprocedural states: Secondary | ICD-10-CM

## 2023-05-21 DIAGNOSIS — M21372 Foot drop, left foot: Secondary | ICD-10-CM

## 2023-05-21 DIAGNOSIS — Z09 Encounter for follow-up examination after completed treatment for conditions other than malignant neoplasm: Secondary | ICD-10-CM

## 2023-06-09 ENCOUNTER — Encounter: Payer: Federal, State, Local not specified - PPO | Admitting: Neurosurgery

## 2023-06-11 ENCOUNTER — Encounter: Payer: Self-pay | Admitting: Neurosurgery

## 2023-06-14 ENCOUNTER — Encounter: Payer: Federal, State, Local not specified - PPO | Admitting: Neurosurgery

## 2023-07-16 ENCOUNTER — Encounter: Payer: Federal, State, Local not specified - PPO | Admitting: Orthopedic Surgery

## 2023-07-21 NOTE — Progress Notes (Unsigned)
   Telephone Visit- Progress Note: Referring Physician:  Alm Bustard, NP [address] [place],  [place] [address]  Primary Physician:  Alm Bustard, NP  This visit was performed via telephone.  Patient location: home Provider location: office  I spent a total of 5 minutes non-face-to-face activities for this visit on the date of this encounter including review of current clinical condition and response to treatment.    Patient has given verbal consent to this telephone visits and we reviewed the limitations of a telephone visit. Patient wishes to proceed.    DOS: 05/06/23   Left L5-S1 microdiscectomy  HISTORY OF PRESENT ILLNESS:  She cancelled her last visit as she was doing well.   She feels much better than she did prior to surgery.   She still has some numbness and tingling in her left foot. Her strength is better, her left foot does not slap when she walks. She still has pain in her lower back that is constant and varies with her activity level. She has intermittent left leg pain to her hip/thigh.   She is in [place] until April and plans to follow up with Dr. Katrinka Blazing when she gets back.    ROS (Neurologic):  Negative except as noted above   Exam: No exam done as this was a telephone encounter.    IMAGING: Nothing new to review.   ASSESSMENT/PLAN:  Sarah Donovan is doing reasonable s/p above surgery. Treatment options reviewed with patient and following plan made:   - She can slowly return to activity as tolerated.  - Follow up when she gets back to town in April.   Advised to contact the office if any questions or concerns arise.  Drake Leach PA-C Neurosurgery

## 2023-07-22 ENCOUNTER — Encounter: Payer: Self-pay | Admitting: Orthopedic Surgery

## 2023-07-22 ENCOUNTER — Ambulatory Visit (INDEPENDENT_AMBULATORY_CARE_PROVIDER_SITE_OTHER): Payer: Federal, State, Local not specified - PPO | Admitting: Orthopedic Surgery

## 2023-07-22 DIAGNOSIS — Z9889 Other specified postprocedural states: Secondary | ICD-10-CM

## 2023-07-22 DIAGNOSIS — M5416 Radiculopathy, lumbar region: Secondary | ICD-10-CM

## 2023-07-22 DIAGNOSIS — M21372 Foot drop, left foot: Secondary | ICD-10-CM

## 2023-10-09 ENCOUNTER — Emergency Department

## 2023-10-09 ENCOUNTER — Emergency Department
Admission: EM | Admit: 2023-10-09 | Discharge: 2023-10-12 | Disposition: A | Discharge status: Home / Self Care | Attending: Emergency Medicine | Admitting: Emergency Medicine

## 2023-10-09 ENCOUNTER — Other Ambulatory Visit: Payer: Self-pay

## 2023-10-09 DIAGNOSIS — R531 Weakness: Secondary | ICD-10-CM | POA: Insufficient documentation

## 2023-10-09 LAB — RESP PANEL BY RT-PCR (RSV, FLU A&B, COVID)  RVPGX2
Influenza A by PCR: NEGATIVE
Influenza B by PCR: NEGATIVE
Resp Syncytial Virus by PCR: NEGATIVE
SARS Coronavirus 2 by RT PCR: NEGATIVE

## 2023-10-09 LAB — BASIC METABOLIC PANEL
Anion gap: 9 (ref 5–15)
BUN: 14 mg/dL (ref 8–23)
CO2: 23 mmol/L (ref 22–32)
Calcium: 9.1 mg/dL (ref 8.9–10.3)
Chloride: 105 mmol/L (ref 98–111)
Creatinine, Ser: 0.82 mg/dL (ref 0.44–1.00)
GFR, Estimated: >60 mL/min (ref >60)
Glucose, Bld: 117 mg/dL — ABNORMAL HIGH (ref 70–99)
Potassium: 4.1 mmol/L (ref 3.5–5.1)
Sodium: 137 mmol/L (ref 135–145)

## 2023-10-09 LAB — URINALYSIS, ROUTINE W REFLEX MICROSCOPIC
Bacteria, UA: NONE SEEN
Bilirubin Urine: NEGATIVE
Glucose, UA: NEGATIVE mg/dL
Hgb urine dipstick: NEGATIVE
Ketones, ur: NEGATIVE mg/dL
Leukocytes,Ua: NEGATIVE
Nitrite: NEGATIVE
Protein, ur: 30 mg/dL — AB
Specific Gravity, Urine: 1.025 (ref 1.005–1.030)
pH: 5 (ref 5.0–8.0)

## 2023-10-09 LAB — CBC
HCT: 37.8 % (ref 36.0–46.0)
Hemoglobin: 12.6 g/dL (ref 12.0–15.0)
MCH: 31.3 pg (ref 26.0–34.0)
MCHC: 33.3 g/dL (ref 30.0–36.0)
MCV: 94 fL (ref 80.0–100.0)
Platelets: 176 10*3/uL (ref 150–400)
RBC: 4.02 MIL/uL (ref 3.87–5.11)
RDW: 12.1 % (ref 11.5–15.5)
WBC: 6.8 10*3/uL (ref 4.0–10.5)
nRBC: 0 % (ref 0.0–0.2)

## 2023-10-09 LAB — CBG MONITORING, ED: Glucose-Capillary: 117 mg/dL — ABNORMAL HIGH (ref 70–99)

## 2023-10-09 NOTE — ED Triage Notes (Addendum)
 Pt to ed from RV for weakness x 1 month. 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 Pt has chronic muscle numbness and weakness on on the right side as deficit from prior stroke. Pt has been in an RV for the last 2 days and felt more weak than normal so she came here. Pt is caox4, and in no acute distress. Pt was assisted out of her RV and into wheelchair. Pt advised she walks with a walker.

## 2023-10-09 NOTE — ED Notes (Signed)
Full rainbow sent down

## 2023-10-09 NOTE — ED Provider Notes (Signed)
 [facility] Provider Note    Event Date/Time   First MD Initiated Contact with Patient 10/09/23 1720     (approximate)   History   Weakness (X 1 month)   HPI  Sarah Donovan is a 73 y.o. female who presents to the ED for evaluation of Weakness (X 1 month)   I reviewed hospitalization documentation from 2/1 - 2/10, admitted to the ICU in [place].  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  Hospitalization complicated by urinary retention requiring intermittent straight catheterizations, pneumonia requiring ceftriaxone, A-fib new onset.  Does not look like she was started on anticoagulation.  Patient presents to the ED at the direction of her PCP to try to get admitted to try to go to a rehab facility.  She reports continued dense numbness to the right side of her body including her right foot making ambulation difficult.  She reports maybe feeling more weak to the same right side over the past 2 days.    Physical Exam   Triage Vital Signs: ED Triage Vitals  Encounter Vitals Group     BP 10/09/23 1618 132/88     Systolic BP Percentile --      Diastolic BP Percentile --      Pulse Rate 10/09/23 1618 80     Resp 10/09/23 1618 16     Temp 10/09/23 1618 98.3 F (36.8 C)     Temp Source 10/09/23 1618 Oral     SpO2 10/09/23 1618 98 %     Weight --      Height 10/09/23 1619 5\' 4"  (1.626 m)     Head Circumference --      Peak Flow --      Pain Score 10/09/23 1618 0     Pain Loc --      Pain Education --      Exclude from Growth Chart --     Most recent vital signs: Vitals:   10/09/23 1618 10/09/23 1900  BP: 132/88 113/63  Pulse: 80 61  Resp: 16 14  Temp: 98.3 F (36.8 C)   SpO2: 98% 96%    General: Awake, no distress.  CV:  Good peripheral perfusion.  Resp:  Normal effort.  Abd:  No distention.  MSK:  No deformity noted.  Neuro:  No focal deficits appreciated.  Right-sided weakness and decree  sensation is noted Other:     ED Results / Procedures / Treatments   Labs (all labs ordered are listed, but only abnormal results are displayed) Labs Reviewed  BASIC METABOLIC PANEL - Abnormal; Notable for the following components:      Result Value   Glucose, Bld 117 (*)    All other components within normal limits  URINALYSIS, ROUTINE W REFLEX MICROSCOPIC - Abnormal; Notable for the following components:   Color, Urine YELLOW (*)    APPearance HAZY (*)    Protein, ur 30 (*)    All other components within normal limits  CBG MONITORING, ED - Abnormal; Notable for the following components:   Glucose-Capillary 117 (*)    All other components within normal limits  RESP PANEL BY RT-PCR (RSV, FLU A&B, COVID)  RVPGX2  CBC    EKG Sinus rhythm with a rate of 79 bpm.  Normal axis and intervals.  No acute signs of acute ischemia.  RADIOLOGY CT head interpreted by me with hypodensity to the left basal ganglia  Official radiology report(s): MR BRAIN WO  CONTRAST Result Date: 10/09/2023 CLINICAL DATA:  eval cva. reportedly an IPH 1 month ago left thalamus EXAM: MRI HEAD WITHOUT CONTRAST TECHNIQUE: Multiplanar, multiecho pulse sequences of the brain and surrounding structures were obtained without intravenous contrast. COMPARISON:  Same day CT head. FINDINGS: Brain: Findings compatible with evolving approximately 2.2 x 1.0 x 1.5 cm intraparenchymal hemorrhage in the left thalamus, which is T1 hyperintense with associated susceptibility artifact peripherally. This hemorrhage appears similar across modalities to same day CT head. No evidence of a surrounding infarct or visible mass lesion on this noncontrast study. No significant mass effect or midline shift. No hydrocephalus. Vascular: Major arterial flow voids are maintained at the skull base. Skull and upper cervical spine: Normal marrow signal. Sinuses/Orbits: Clear sinuses.  No acute orbital findings. Other: No mastoid effusions. IMPRESSION:  Findings compatible with evolving left thalamic hemorrhage. No evidence of surrounding infarct. Electronically Signed   By: Feliberto Harts M.D.   On: 10/09/2023 21:33   CT HEAD WO CONTRAST Result Date: 10/09/2023 CLINICAL DATA:  Neuro deficit, acute, stroke suspected EXAM: CT HEAD WITHOUT CONTRAST TECHNIQUE: Contiguous axial images were obtained from the base of the skull through the vertex without intravenous contrast. RADIATION DOSE REDUCTION: This exam was performed according to the departmental dose-optimization program which includes automated exposure control, adjustment of the mA and/or kV according to patient size and/or use of iterative reconstruction technique. COMPARISON:  None Available. FINDINGS: Brain: Ill-defined area of low-density in the left basal ganglia an internal capsule suspicious for acute/subacute ischemia. No hemorrhage. No hydrocephalus, extra-axial collection or midline shift. Brain volume is normal for age. Mild periventricular hypodensity is typical of chronic small vessel ischemia, but nonspecific. Vascular: No hyperdense vessel or unexpected calcification. Skull: No fracture or focal lesion. Sinuses/Orbits: Paranasal sinuses and mastoid air cells are clear. The visualized orbits are unremarkable. Other: None. IMPRESSION: 1. Ill-defined area of low-density in the left basal ganglia and internal capsule suspicious for acute/subacute ischemia. Recommend further assessment with MRI. 2. Mild chronic small vessel ischemia. Electronically Signed   By: Narda Rutherford M.D.   On: 10/09/2023 17:22    PROCEDURES and INTERVENTIONS:  Procedures  Medications - No data to display   IMPRESSION / MDM / ASSESSMENT AND PLAN / ED COURSE  I reviewed the triage vital signs and the nursing notes.  Differential diagnosis includes, but is not limited to, new stroke, chronic symptoms, metabolic encephalopathy, sepsis, UTI, viral syndrome  {Patient presents with symptoms of an acute illness  or injury that is potentially life-threatening.  Patient presents with persistent right sided neurologic deficits after stroke that occurred last month, having difficulty caring for herself.  Lives by herself in an RV with minimal support.  Reports that she cannot get around by herself.  No acute symptoms.  Workup is benign for evidence of acute pathology.  MRI without acute features.  Discussed options with her and she does not feel comfortable with outpatient management, which is reasonable considering her living situation.  Will have to board the patient here unfortunately and have social work and PT see her for possible rehab placement  Clinical Course as of 10/09/23 2258  Sat Oct 09, 2023  2201 Reassessed after MRI and discussed options.  She does not feel comfortable with outpatient management as she lives by herself in an RV.  She agrees to board in the hallway pending social work and PT for rehab placement [DS]    Clinical Course User Index [DS] Delton Prairie, MD  FINAL CLINICAL IMPRESSION(S) / ED DIAGNOSES   Final diagnoses:  Right sided weakness     Rx / DC Orders   ED Discharge Orders     None        Note:  This document was prepared using Dragon voice recognition software and may include unintentional dictation errors.   Delton Prairie, MD 10/09/23 2258

## 2023-10-10 MED ORDER — MAGNESIUM OXIDE -MG SUPPLEMENT 400 (240 MG) MG PO TABS
200.0000 mg | ORAL_TABLET | Freq: Every day | ORAL | Status: DC
  Filled 2023-10-10 (×3): qty 1

## 2023-10-10 MED ORDER — ROSUVASTATIN CALCIUM 10 MG PO TABS
10.0000 mg | ORAL_TABLET | Freq: Every evening | ORAL | Status: DC
  Administered 2023-10-10: 10 mg via ORAL
  Filled 2023-10-10 (×3): qty 1

## 2023-10-10 MED ORDER — OMEPRAZOLE 20 MG PO TBDD
20.0000 mg | DELAYED_RELEASE_TABLET | Freq: Every day | ORAL | Status: DC
  Administered 2023-10-10 – 2023-10-12 (×3): 20 mg via ORAL
  Filled 2023-10-10 (×3): qty 1

## 2023-10-10 MED ORDER — FOLIC ACID 1 MG PO TABS
1.0000 mg | ORAL_TABLET | Freq: Every day | ORAL | Status: DC
  Filled 2023-10-10 (×2): qty 1

## 2023-10-10 MED ORDER — SENNA 8.6 MG PO TABS: 1.0000 | ORAL_TABLET | Freq: Every day | ORAL | Status: DC | PRN

## 2023-10-10 MED ORDER — ESCITALOPRAM OXALATE 10 MG PO TABS
20.0000 mg | ORAL_TABLET | Freq: Every day | ORAL | Status: DC
  Administered 2023-10-10: 20 mg via ORAL
  Filled 2023-10-10 (×3): qty 2

## 2023-10-10 MED ORDER — VITAMIN B-12 1000 MCG PO TABS
1000.0000 ug | ORAL_TABLET | Freq: Every day | ORAL | Status: DC
  Filled 2023-10-10 (×4): qty 1

## 2023-10-10 NOTE — ED Notes (Signed)
 Pt given dinner tray.

## 2023-10-10 NOTE — ED Notes (Signed)
 This RN assisted pt in ambulatory with walker to bathroom. Pt able to void. Weakness noted to right arm and leg and pt reports her RT hand feeling numb. Hx of stroke per pt. This RN assisted pt back into bed. Call light in reach, wheels locked, bed at the lowest position, warm blanket given.

## 2023-10-10 NOTE — ED Notes (Signed)
 Pt assisted to bathroom

## 2023-10-10 NOTE — Progress Notes (Signed)
 Occupational Therapy Evaluation Patient Details Name: Sarah Donovan MRN: [id number] DOB: July 22, 1951 Today's Date: 10/10/2023   History of Present Illness   Sarah Donovan is a 73 y.o. female who presents to the ED for evaluation of Weakness (X 1 month) 2/1 - 2/10, admitted to the ICU in [place].  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 Hospitalization complicated by urinary retention requiring intermittent straight catheterizations, pneumonia requiring ceftriaxone, A-fib new onset.  Does not look like she was started on anticoagulation. Patient presents to the ED at the direction of her PCP to try to get admitted to try to go to a rehab facility.  She reports continued dense numbness to the right side of her body including her right foot making ambulation difficult.  She reports maybe feeling more weak to the same right side over the past 2 days.     Clinical Impressions Pt was seen for OT evaluation this date. Prior to hospital admission, pt was living indep in RV/mobile home. Pt presents to acute OT demonstrating impaired ADL performance, decreased sensation, balance, and weakness. (See OT problem list for additional functional deficits). Pt completed STS from bed with MIN A physical assistance, able take lateral/forward steps along bed - MIN A for stability with RW and verbal+tactile cues for sequencing. Pt very fearful of falling on this date due to decreased sensation in her right side. Pt would benefit from skilled OT services to address noted impairments and functional limitations (see below for any additional details) in order to maximize safety and independence while minimizing falls risk and caregiver burden. OT will follow acutely.    If plan is discharge home, recommend the following:   A lot of help with walking and/or transfers;A lot of help with bathing/dressing/bathroom;Assistance with cooking/housework;Assist for transportation;Help with  stairs or ramp for entrance     Functional Status Assessment   Patient has had a recent decline in their functional status and demonstrates the ability to make significant improvements in function in a reasonable and predictable amount of time.     Equipment Recommendations   Other (comment) (Next venue of care)     Recommendations for Other Services         Precautions/Restrictions   Precautions Precautions: Fall Recall of Precautions/Restrictions: Intact     Mobility Bed Mobility Overal bed mobility: Modified Independent             General bed mobility comments: No physical assist needed    Transfers Overall transfer level: Needs assistance Equipment used: Rolling walker (2 wheels) Transfers: Sit to/from Stand Sit to Stand: MINA           General transfer comment: Pt STS from bed with no physical assistance, able take lateral/forward steps along bed - MINA, with RW and verbal+tactile cues for sequencing. Pt very fearful of falling on this date due to decreased sensation in her right side.      Balance Overall balance assessment: Needs assistance Sitting-balance support: Bilateral upper extremity supported, Feet unsupported Sitting balance-Leahy Scale: Normal     Standing balance support: Reliant on assistive device for balance, During functional activity, Bilateral upper extremity supported Standing balance-Leahy Scale: Fair                             ADL either performed or assessed with clinical judgement   ADL Overall ADL's : Needs assistance/impaired  Lower Body Dressing: Independent;Bed level               Functional mobility during ADLs: Rolling walker (2 wheels);Contact guard assist;Cueing for safety;Cueing for sequencing General ADL Comments: Will continue to assess Pt ADL abilities on next date     Vision Baseline Vision/History: 0 No visual deficits Vision Assessment?: Yes Eye  Alignment: Within Functional Limits Ocular Range of Motion: Within Functional Limits Alignment/Gaze Preference: Within Defined Limits Tracking/Visual Pursuits: Able to track stimulus in all quads without difficulty Saccades: Within functional limits Convergence: Within functional limits Visual Fields: No apparent deficits Additional Comments: Pt reports she feels like there is clouds in her peripheral vision, no change in ability to read far away     Perception         Praxis         Pertinent Vitals/Pain Pain Assessment Pain Assessment: No/denies pain     Extremity/Trunk Assessment Upper Extremity Assessment Upper Extremity Assessment: Overall WFL for tasks assessed;Generalized weakness;RUE deficits/detail RUE Deficits / Details: Pt reports weakness throughout RUE/RLE RUE Sensation: decreased light touch RUE Coordination: WNL   Lower Extremity Assessment Lower Extremity Assessment: RLE deficits/detail;Generalized weakness RLE Deficits / Details: Pt reports feeling like her foot is an air balloon, unable to feel it fully during amb RLE Sensation: decreased light touch RLE Coordination: decreased gross motor   Cervical / Trunk Assessment Cervical / Trunk Assessment: Normal   Communication Communication Communication: No apparent difficulties   Cognition Arousal: Alert Behavior During Therapy: WFL for tasks assessed/performed Cognition: No family/caregiver present to determine baseline, No apparent impairments             OT - Cognition Comments: Pt A/O x4, eager to work with therapy                 Following commands: Intact       Cueing  General Comments   Cueing Techniques: Verbal cues  Pt seemly anxious about decreased sensation and being without assitance at home.   Exercises Exercises: Other exercises Other Exercises Other Exercises: Edu: role of OT, d/c planning and benefits of rehab, bed level exercises for right sides weakness   Shoulder  Instructions      Home Living Family/patient expects to be discharged to:: Private residence Living Arrangements: Alone Available Help at Discharge:  (None) Type of Home: Mobile home Home Access: Stairs to enter Entrance Stairs-Number of Steps: 3   Home Layout: One level     Bathroom Shower/Tub: Walk-in shower         Home Equipment: [employer] - single point   Additional Comments: Lives in camper, uses shower at [employer] mostly      Prior Functioning/Environment Prior Level of Function : Independent/Modified Independent             Mobility Comments: Indep, no DME PTA ADLs Comments: Indep in all ADL/IADL PTA    OT Problem List: Decreased strength;Decreased activity tolerance;Impaired balance (sitting and/or standing);Decreased coordination;Decreased safety awareness;Decreased knowledge of use of DME or AE;Impaired sensation   OT Treatment/Interventions: Self-care/ADL training;Therapeutic exercise;Energy conservation;Neuromuscular education;DME and/or AE instruction;Therapeutic activities;Cognitive remediation/compensation;Visual/perceptual remediation/compensation;Balance training      OT Goals(Current goals can be found in the care plan section)   Acute Rehab OT Goals Patient Stated Goal: to go to rehab OT Goal Formulation: With patient Time For Goal Achievement: 10/24/23 Potential to Achieve Goals: Good   OT Frequency:  Min 3X/week    Co-evaluation  AM-PAC OT "6 Clicks" Daily Activity     Outcome Measure Help from another person eating meals?: None Help from another person taking care of personal grooming?: A Little Help from another person toileting, which includes using toliet, bedpan, or urinal?: A Lot Help from another person bathing (including washing, rinsing, drying)?: A Lot Help from another person to put on and taking off regular upper body clothing?: None Help from another person to put on and taking off regular lower body  clothing?: A Little 6 Click Score: 18   End of Session Equipment Utilized During Treatment: Gait belt;Rolling walker (2 wheels) Nurse Communication: Mobility status  Activity Tolerance: Patient tolerated treatment well Patient left: in bed;with call bell/phone within reach;with bed alarm set  OT Visit Diagnosis: Unsteadiness on feet (R26.81);Other abnormalities of gait and mobility (R26.89);Muscle weakness (generalized) (M62.81);Hemiplegia and hemiparesis Hemiplegia - Right/Left: Right                Time: 1530-1601 OT Time Calculation (min): 31 min Charges:  OT Evaluation $OT Eval Moderate Complexity: 1 Mod OT Treatments $Self Care/Home Management : 8-22 mins  Glenard Haring M.S. OTR/L  10/10/23, 5:16 PM

## 2023-10-10 NOTE — ED Notes (Signed)
 This RN received report from Leanna Sato Paramedic and performed care handoff. This RN introduced self to pt. Call light in reach, bed wheels locked, side rail raised, pt updated on plan of care. Rounding completed.

## 2023-10-10 NOTE — ED Notes (Signed)
 This RN gave report to [employer] and performed bedside care handoff. Call light in reach, bed wheels locked, side rail raised, pt updated on plan of care. Rounding completed.

## 2023-10-10 NOTE — ED Notes (Signed)
 Assumed care of patient. Patient laying on stretcher with eyes closed. Even respirations noted.

## 2023-10-11 NOTE — TOC CM/SW Note (Signed)
 OT recommending acute inpatient rehab. Per chart review, patient was at a hospital in [place] 2/1-2/10 and then went to an inpatient rehab there from 2/10-3/6. It does not appear that they set her up with home health as their notes state to follow up with her PCP. CSW sent secure chat to one of the [facility] inpatient rehab admissions coordinators to see if patient would be eligible at least with her insurance. She stated she did not think so "since she has not had an acute medical change that necessitates an inpatient hospital admission, rehab or otherwise." PT evaluation is pending. Per RN note this morning, patient was ambulatory to the bathroom.  Charlynn Court, CSW [phone]

## 2023-10-11 NOTE — Evaluation (Addendum)
 Physical Therapy Evaluation Patient Details Name: Sarah Donovan MRN: [id number] DOB: Dec 20, 1950 Today's Date: 10/11/2023  History of Present Illness  Pt is a 73 y/o F admitted on 10/09/23 after presenting with c/o weakness x 1 month at the direction of her PCP. PMH: hospitalization 2/1-2/10 in [place] for L thalamic ICH (complicated by urinary retention, PNA, new onset a-fib)  Clinical Impression  Pt seen for PT evaluation with pt agreeable to tx. Pt reports prior to her stroke she was independent without AD, driving, traveling in her RV. Pt notes since her CVA she has had residual R sided weakness. Pt also presents with decreased RUE sensation to light touch & absent sensation in RLE which impacts balance & overall mobility. Pt requires up to mod assist to ambulate without AD & min assist with HHA. Pt is able to ambulate with CGA<>close supervision with RW but still very cautious gait. Pt would benefit from ongoing rehab services to address balance, RLE/RUE strengthening, & gait & stair negotiation.  (Educated pt on stroke recovery.)      If plan is discharge home, recommend the following: A little help with walking and/or transfers;A little help with bathing/dressing/bathroom;Assistance with cooking/housework;Assist for transportation;Help with stairs or ramp for entrance   Can travel by private vehicle        Equipment Recommendations None recommended by PT  Recommendations for Other Services       Functional Status Assessment Patient has had a recent decline in their functional status and demonstrates the ability to make significant improvements in function in a reasonable and predictable amount of time.     Precautions / Restrictions Precautions Precautions: Fall Recall of Precautions/Restrictions: Intact Restrictions Weight Bearing Restrictions Per Provider Order: No      Mobility  Bed Mobility Overal bed mobility: Modified Independent Bed Mobility: Supine to Sit      Supine to sit: Modified independent (Device/Increase time), HOB elevated          Transfers Overall transfer level: Needs assistance Equipment used: None, 1 person hand held assist, Rolling walker (2 wheels) Transfers: Sit to/from Stand Sit to Stand: Contact guard assist           General transfer comment: STS from ED stretcher    Ambulation/Gait Ambulation/Gait assistance: Min assist, Contact guard assist, Supervision, Mod assist Gait Distance (Feet): 45 Feet (+ 25 ft) Assistive device: 1 person hand held assist, None, Rolling walker (2 wheels)   Gait velocity: decreased     General Gait Details: Pt ambulates with RUE HHA or no HHA support when ambulating ~45 ft; pt with wide BOS, slightly decreased heel strike RLE, decreased coordination RLE/slight ataxia. Pt with 2 LOB requiring mod<>max assist when someone walks by 2/2 pt becoming startled. Pt ambulates with RW & close supervision<>CGA with improved balance.  Stairs            Wheelchair Mobility     Tilt Bed    Modified Rankin (Stroke Patients Only)       Balance Overall balance assessment: Needs assistance Sitting-balance support: Bilateral upper extremity supported, Feet unsupported Sitting balance-Leahy Scale: Good     Standing balance support: During functional activity, No upper extremity supported Standing balance-Leahy Scale: Poor                               Pertinent Vitals/Pain Pain Assessment Pain Assessment: Faces Faces Pain Scale: Hurts little more Pain Location: RUE, RLE Pain Descriptors /  Indicators: Discomfort, Numbness, Tingling, Tightness Pain Intervention(s): Monitored during session    Home Living Family/patient expects to be discharged to:: Private residence Living Arrangements: Alone Available Help at Discharge:  (none) Type of Home:  (RV) Home Access: Stairs to enter Entrance Stairs-Rails: None Entrance Stairs-Number of Steps: 3   Home Layout: One  level Home Equipment: Cane - single Librarian, academic (2 wheels) Additional Comments: Lives in camper, uses shower at [employer] mostly    Prior Function Prior Level of Function : Independent/Modified Independent             Mobility Comments: Pt reports prior to her stroke she was independent without AD, driving, traveling. Since stroke, has been using RW or holding to furniture in small RV. ADLs Comments: Dressing, toileting without assistance despite difficulty (pt has no one to assist)     Extremity/Trunk Assessment   Upper Extremity Assessment Upper Extremity Assessment: Defer to OT evaluation RUE Deficits / Details: decreased RUE strength RUE Sensation: decreased light touch    Lower Extremity Assessment Lower Extremity Assessment: RLE deficits/detail RLE Deficits / Details: grossly 3+/5, no buckling in standing or gait RLE Sensation:  (absent sensation to light touch) RLE Coordination: decreased gross motor    Cervical / Trunk Assessment Cervical / Trunk Assessment: Normal  Communication   Communication Communication: No apparent difficulties    Cognition Arousal: Alert Behavior During Therapy: WFL for tasks assessed/performed   PT - Cognitive impairments: Memory                       PT - Cognition Comments: Pt reports decreased recall of hospitalization in February. Following commands: Intact       Cueing Cueing Techniques: Verbal cues     General Comments      Exercises Other Exercises Other Exercises: Pt with many concerns re: inability to drive & her RV possbily being towed - case manager made aware.   Assessment/Plan    PT Assessment Patient needs continued PT services  PT Problem List Decreased strength;Decreased coordination;Pain;Decreased range of motion;Impaired sensation;Decreased activity tolerance;Decreased balance;Decreased mobility;Decreased knowledge of use of DME;Decreased safety awareness       PT Treatment  Interventions DME instruction;Balance training;Neuromuscular re-education;Gait training;Stair training;Functional mobility training;Therapeutic activities;Therapeutic exercise;Manual techniques;Patient/family education    PT Goals (Current goals can be found in the Care Plan section)  Acute Rehab PT Goals Patient Stated Goal: get better, go to rehab PT Goal Formulation: With patient Time For Goal Achievement: 10/25/23 Potential to Achieve Goals: Good    Frequency Min 2X/week     Co-evaluation               AM-PAC PT "6 Clicks" Mobility  Outcome Measure Help needed turning from your back to your side while in a flat bed without using bedrails?: None Help needed moving from lying on your back to sitting on the side of a flat bed without using bedrails?: None Help needed moving to and from a bed to a chair (including a wheelchair)?: A Little Help needed standing up from a chair using your arms (e.g., wheelchair or bedside chair)?: A Little Help needed to walk in hospital room?: A Lot Help needed climbing 3-5 steps with a railing? : A Lot 6 Click Score: 18    End of Session   Activity Tolerance: Patient tolerated treatment well Patient left: in bed (in hallway by nurses station)   PT Visit Diagnosis: Unsteadiness on feet (R26.81);Hemiplegia and hemiparesis;Difficulty in walking, not elsewhere  classified (R26.2);Other abnormalities of gait and mobility (R26.89);Muscle weakness (generalized) (M62.81) Hemiplegia - Right/Left: Right Hemiplegia - dominant/non-dominant: Dominant Hemiplegia - caused by: Other Nontraumatic intracranial hemorrhage    Time: 0902-0921 PT Time Calculation (min) (ACUTE ONLY): 19 min   Charges:   PT Evaluation $PT Eval Moderate Complexity: 1 Mod   PT General Charges $$ ACUTE PT VISIT: 1 Visit         Aleda Grana, PT, DPT 10/11/23, 10:06 AM   [name] 10/11/2023, 10:04 AM

## 2023-10-11 NOTE — TOC Initial Note (Addendum)
 Transition of Care [facility]) - Initial/Assessment Note    Patient Details  Name: Sarah Donovan MRN: [id number] Date of Birth: [birth year]  [facility] CM/SW Contact:    Colin Broach, LCSW Phone Number: 10/11/2023, 12:36 PM  Clinical Narrative:                 CM met with patient to discuss discharge plan.  Insurance and demographic information was verified.  Patient was at a hospital in [place] 2/1-2/10 and then went to an inpatient rehab there from 2/10-3/6. It does not appear that they set her up with home health as their notes state to follow up with her PCP.  Patient referred for CIR, however, that has been denied.  Patient then referred to SNF for rehab.  Waiting to see if she will qualify based on previously being in a rehab.  Patient hasn't had an acute medical change that necessitates an inpatient stay.  Patient currently lives in an RV and has no support in this area.  Patient states that she lives in this area when she's not traveling and she does have an established PCP. CSW explained that going to rehab isn't a guarantee, but the referrals will be made.  Patient states that she understands that, if she is denied SNF, she can go home with home health orders.  CSW will continue to follow for discharge planning needs.   2:34pm  CSW spoke with Eulah Citizen, in Security, to ask if he would speak with patient about her RV being parked in the lot while she's here.  Eulah Citizen will speak with her to let her know that it's ok that the vehicle is parked here while she's in the hospital.  Expected Discharge Plan: Skilled Nursing Facility Barriers to Discharge: SNF Pending bed offer, Continued Medical Work up   Patient Goals and CMS Choice     Choice offered to / list presented to : Patient      Expected Discharge Plan and Services       Living arrangements for the past 2 months:  (Patient lives in a RV. She's been living in her RV for approximately 6 months.)                                       Prior Living Arrangements/Services Living arrangements for the past 2 months:  (Patient lives in a RV. She's been living in her RV for approximately 6 months.) Lives with:: Self Patient language and need for interpreter reviewed:: Yes Do you feel safe going back to the place where you live?: Yes      Need for Family Participation in Patient Care: No (Comment) Care giver support system in place?: No (comment)   Criminal Activity/Legal Involvement Pertinent to Current Situation/Hospitalization: No - Comment as needed  Activities of Daily Living      Permission Sought/Granted                  Emotional Assessment Appearance:: Appears stated age Attitude/Demeanor/Rapport: Engaged Affect (typically observed): Anxious, Appropriate Orientation: : Oriented to Self, Oriented to Situation, Oriented to Place, Oriented to  Time Alcohol / Substance Use: Not Applicable Psych Involvement: No (comment)  Admission diagnosis:  weakness Patient Active Problem List   Diagnosis Date Noted   Back pain with left-sided radiculopathy 03/29/2023   Left foot drop 03/29/2023   Spondylosis, cervical, with myelopathy 03/29/2023   Genetic  testing 06/15/2022   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, estrogen receptor positive (HCC) 06/01/2022   Malignant neoplasm of lower-outer quadrant of right breast of female, estrogen receptor positive (HCC) 05/28/2022   Diarrhea 11/18/2020   Dyslipidemia 10/07/2020   Healthcare maintenance 09/03/2020   Herpetic dermatitis 04/12/2020   Vitamin D deficiency 03/12/2020   Fatigue 03/07/2020   Right sided sciatica 03/07/2020   Osteopenia of both hips 07/21/2019   Chronic right hip pain 07/03/2019   Chronic right shoulder pain 07/03/2019   Colon polyp 06/20/2018   DDD (degenerative disc disease), lumbar 06/20/2018   Elevated blood pressure reading 06/20/2018   Family history of breast cancer in sister  06/20/2018   History of hysterectomy for benign disease 06/20/2018   Recurrent UTI 06/20/2018   Refused influenza vaccine 06/20/2018   Current moderate episode of major depressive disorder without prior episode (HCC) 09/14/2017   Elevated TSH 09/14/2017   Prediabetes 09/14/2017   Alcohol use disorder 01/08/2017   H/O herpes zoster 01/08/2017   Mixed hyperlipidemia 03/16/2016   Trigger finger, acquired 03/16/2016   Sleep apnea 03/16/2016   Anxiety and depression 10/30/2015   Gastroesophageal reflux disease without esophagitis 10/30/2015   Herpes zoster 10/30/2015   Chronic cervical radiculopathy 10/30/2015   Lumbar back pain with radiculopathy affecting right lower extremity 10/30/2015   PCP:  Alm Bustard, NP Pharmacy:   [id number] [address] ([address]), [place] - [address] [place] ([place]) [place] [address] Phone: [phone] Fax: [phone]  [facility] [phone] - [place], [place] - [address] AT Corner of [address] [place] [address] Phone: [phone] Fax: [phone]     Social Drivers of Health (SDOH) Social History: SDOH Screenings   Food Insecurity: Patient Unable To Answer (09/05/2023)   Received from [facility]  Housing: Patient Unable To Answer (09/05/2023)   Received from [facility]  Transportation Needs: Patient Unable To Answer (09/05/2023)   Received from [facility]  Utilities: Patient Unable To Answer (09/05/2023)   Received from [facility]  Financial Resource Strain: Medium Risk (04/16/2023)   Received from [facility]  Tobacco Use: Medium Risk (10/09/2023)  Health Literacy: Low Risk  (01/23/2021)   [facility] from [facility]   SDOH Interventions:     Readmission Risk Interventions     No data to display

## 2023-10-11 NOTE — Progress Notes (Signed)
 Mobility Specialist - Progress Note     10/11/23 1536  Mobility  Activity Stood at bedside;Ambulated independently in hallway  Level of Assistance Modified independent, requires aide device or extra time  Assistive Device Front wheel walker  Distance Ambulated (ft) 100 ft  Range of Motion/Exercises Active  Activity Response Tolerated well  Mobility Referral Yes  Mobility visit 1 Mobility  Mobility Specialist Start Time (ACUTE ONLY) 1519  Mobility Specialist Stop Time (ACUTE ONLY) 1534  Mobility Specialist Time Calculation (min) (ACUTE ONLY) 15 min   Pt resting in bed on RA upon entry. Pt STS and ambulates to hallway ModI with RW. Pt endorses lower back pain 3/10 but agreeable to participate. Pt stopped before making U-turn to avoid falling or tripping over feet. Pt returned to stretcher and left with needs in reach.   Johnathan Hausen Mobility Specialist 10/11/23, 3:42 PM

## 2023-10-11 NOTE — Progress Notes (Signed)
 Occupational Therapy Treatment Patient Details Name: Sarah Donovan MRN: [id number] DOB: 1951-03-14 Today's Date: 10/11/2023   History of present illness Pt is a 73 y/o F admitted on 10/09/23 after presenting with c/o weakness x 1 month at the direction of her PCP. PMH: hospitalization 2/1-2/10 in [place] for L thalamic ICH (complicated by urinary retention, PNA, new onset a-fib)   OT comments  Pt seen for OT treatment on this date. Upon arrival to room pt supine on ED bed, agreeable to tx. Throughout session pt was anxious about the uncertainty regarding her d/c (CSW aware). She is worries about taking care her herself awhile living in her RV. Pt completed amb to hallway bathroom for sink level ADLs. Pt completed oral care with CGA, min A for fine motor tasks of opening toothpaste due to her deminished sensation/coodination in her R side. Noted pt slight swaying while completing sink level ADLs. Pt able to self correct with non-affected UE. Pt participated in UE weightbearing exercises to continue progressing her coordination within her RUE. Demonstrated good carryover of learned exercises and body awareness techniques for diminished sensation. Pt making good progress toward goals, will continue to follow POC. Discharge recommendation remains appropriate.        If plan is discharge home, recommend the following:  A lot of help with walking and/or transfers;A lot of help with bathing/dressing/bathroom;Assistance with cooking/housework;Assist for transportation;Help with stairs or ramp for entrance   Equipment Recommendations  Other (comment)    Recommendations for Other Services      Precautions / Restrictions Precautions Precautions: Fall Recall of Precautions/Restrictions: Intact Restrictions Weight Bearing Restrictions Per Provider Order: No       Mobility Bed Mobility Overal bed mobility: Modified Independent Bed Mobility: Supine to Sit     Supine to sit: Modified independent  (Device/Increase time), HOB elevated     General bed mobility comments: No physical assist needed    Transfers Overall transfer level: Needs assistance Equipment used: Rolling walker (2 wheels) Transfers: Sit to/from Stand Sit to Stand: Contact guard assist           General transfer comment: STS from ED stretcher     Balance Overall balance assessment: Needs assistance Sitting-balance support: Bilateral upper extremity supported, Feet unsupported Sitting balance-Leahy Scale: Good     Standing balance support: During functional activity, Single extremity supported Standing balance-Leahy Scale: Poor Standing balance comment: Often swaying noted due to loss of balance while standing at sink, able to self correct by holding on to sink/RW                           ADL either performed or assessed with clinical judgement   ADL Overall ADL's : Needs assistance/impaired     Grooming: Oral care;Wash/dry face;Standing (Sink level)                               Functional mobility during ADLs: Rolling walker (2 wheels);Cueing for safety;Minimal assistance General ADL Comments: Pt completed amb to hallway bathroom for sink level ADLs. Pt completed oral care with CGA, min A for fine motor tasks of opening toothpaste due to her deminished sensation/coodination in her R side.    Extremity/Trunk Assessment Upper Extremity Assessment RUE Sensation: decreased light touch RUE Coordination: decreased fine motor   Lower Extremity Assessment RLE Sensation: decreased light touch (Sensation making min improvements) RLE Coordination: decreased gross motor  Vision   Additional Comments: Pt reports vision feeling like she is at her baseline             Communication Communication Communication: No apparent difficulties   Cognition Arousal: Alert Behavior During Therapy: WFL for tasks assessed/performed Cognition: No family/caregiver present to  determine baseline, No apparent impairments             OT - Cognition Comments: Pt A/O x4, stated being anxious about what will happen if she gets d/c from hospital.                 Following commands: Intact        Cueing   Cueing Techniques: Verbal cues  Exercises Exercises: Other exercises Other Exercises Other Exercises: Pt reported having many concerns re: inability to drive & her RV possbily being towed - case manager made aware. Other Exercises: WB through affected UE against wall with RW close and CGA for stability, Other Exercises: Edu: Body awareness due to decreased sensation during ADL//IADL    Shoulder Instructions       General Comments Vision reported at her baseline    Pertinent Vitals/ Pain       Pain Assessment Pain Assessment: Faces Faces Pain Scale: Hurts little more Pain Location: RUE, RLE Pain Descriptors / Indicators: Discomfort, Numbness, Tingling, Tightness Pain Intervention(s): Limited activity within patient's tolerance, Repositioned, Monitored during session                                                          Frequency  Min 3X/week        Progress Toward Goals  OT Goals(current goals can now be found in the care plan section)  Progress towards OT goals: Progressing toward goals  Acute Rehab OT Goals Patient Stated Goal: to go to rehab OT Goal Formulation: With patient Time For Goal Achievement: 10/24/23 Potential to Achieve Goals: Good  Plan      Co-evaluation                 AM-PAC OT "6 Clicks" Daily Activity     Outcome Measure   Help from another person eating meals?: None Help from another person taking care of personal grooming?: A Little Help from another person toileting, which includes using toliet, bedpan, or urinal?: A Lot Help from another person bathing (including washing, rinsing, drying)?: A Lot Help from another person to put on and taking off regular upper body  clothing?: None Help from another person to put on and taking off regular lower body clothing?: A Little 6 Click Score: 18    End of Session Equipment Utilized During Treatment: Gait belt;Rolling walker (2 wheels)  OT Visit Diagnosis: Unsteadiness on feet (R26.81);Other abnormalities of gait and mobility (R26.89);Muscle weakness (generalized) (M62.81);Hemiplegia and hemiparesis Hemiplegia - Right/Left: Right   Activity Tolerance Patient tolerated treatment well   Patient Left in bed   Nurse Communication Mobility status        Time: 8657-8469 OT Time Calculation (min): 24 min  Charges: OT General Charges $OT Visit: 1 Visit OT Treatments $Self Care/Home Management : 8-22 mins $Neuromuscular Re-education: 8-22 mins  Glenard Haring M.S. OTR/L  10/11/23, 3:26 PM

## 2023-10-11 NOTE — ED Provider Notes (Signed)
-----------------------------------------   4:14 AM on 10/11/2023 -----------------------------------------   Blood pressure (!) 148/64, pulse 61, temperature 98.3 F (36.8 C), temperature source Oral, resp. rate 18, height 5\' 4"  (1.626 m), SpO2 100%.  The patient is calm and cooperative at this time.  There have been no acute events since the last update.  Awaiting disposition plan from case management/social work.    Martesha Niedermeier, Layla Maw, DO 10/11/23 [phone]

## 2023-10-11 NOTE — TOC PASRR Note (Cosign Needed Addendum)
   RE: Sarah Donovan  Date of Birth: 1950/12/18  Date: 10-11-2023   To Whom It May Concern:  Please be advised that the above-named patient will require a short-term nursing home stay - anticipated 30 days or less for rehabilitation and strengthening.  The plan is for return home.

## 2023-10-11 NOTE — ED Notes (Signed)
 Pt ambulatory to bathroom

## 2023-10-11 NOTE — ED Notes (Signed)
 This RN received report from Georgann Housekeeper RN and performed bedside care handoff. This RN introduced self to pt. Call light in reach, bed wheels locked, side rail raised, pt updated on plan of care. Rounding completed.

## 2023-10-11 NOTE — [place] FL2 (Addendum)
 [place] MEDICAID [id number] LEVEL OF CARE FORM     IDENTIFICATION  Patient Name: Sarah Donovan Birthdate: 06-02-1951 Sex: female Admission Date (Current Location): 10/09/2023  [facility] [place] Number:  Chiropodist and Address:  [facility], [address] [place], [place] [address]      Provider Number: [id number]  Attending Physician Name and Address:  No att. 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, estrogen receptor positive (HCC) 06/01/2022   Malignant neoplasm of lower-outer quadrant of right breast of female, estrogen receptor positive (HCC) 05/28/2022   Diarrhea 11/18/2020   Dyslipidemia 10/07/2020   Healthcare maintenance 09/03/2020   Herpetic dermatitis 04/12/2020   Vitamin D deficiency 03/12/2020   Fatigue 03/07/2020   Right sided sciatica 03/07/2020   Osteopenia of both hips 07/21/2019   Chronic right hip pain 07/03/2019   Chronic right shoulder pain 07/03/2019   Colon polyp 06/20/2018   DDD (degenerative disc disease), lumbar 06/20/2018   Elevated blood pressure reading 06/20/2018   Family history of breast cancer in sister 06/20/2018   History of hysterectomy for benign disease 06/20/2018   Recurrent UTI 06/20/2018   Refused influenza vaccine 06/20/2018   Current moderate episode of major depressive disorder without prior episode (HCC) 09/14/2017   Elevated TSH 09/14/2017   Prediabetes 09/14/2017   Alcohol use disorder  01/08/2017   H/O herpes zoster 01/08/2017   Mixed hyperlipidemia 03/16/2016   Trigger finger, acquired 03/16/2016   Sleep apnea 03/16/2016   Anxiety and depression 10/30/2015   Gastroesophageal reflux disease without esophagitis 10/30/2015   Herpes zoster 10/30/2015   Chronic cervical radiculopathy 10/30/2015   Lumbar back pain with radiculopathy affecting right lower extremity 10/30/2015    Orientation RESPIRATION BLADDER Height & Weight     Self, Time, Situation, Place  Normal Continent Weight:   Height:  5\' 4"  (162.6 cm)  BEHAVIORAL SYMPTOMS/MOOD NEUROLOGICAL BOWEL NUTRITION STATUS      Continent Diet  AMBULATORY STATUS COMMUNICATION OF NEEDS Skin   Limited Assist Verbally Normal                       Personal Care Assistance Level of Assistance              Functional Limitations Info  Sight, Hearing, Speech Sight Info: Adequate Hearing Info: Adequate Speech Info: Adequate    SPECIAL CARE FACTORS FREQUENCY                       Contractures Contractures Info: Not present    Additional Factors Info  Code Status, Allergies Code Status Info: not on file Allergies Info: Artichokes, Penicillin, aspirin           Current Medications (10/11/2023):  This is the current hospital active medication list Current Facility-Administered Medications  Medication Dose Route Frequency Provider Last Rate Last Admin   cyanocobalamin (VITAMIN B12) tablet 1,000 mcg  1,000 mcg Oral Daily Willy Eddy, MD  escitalopram (LEXAPRO) tablet 20 mg  20 mg Oral Daily Phineas Semen, MD   20 mg at 10/10/23 2054   folic acid (FOLVITE) tablet 1 mg  1 mg Oral Daily Willy Eddy, MD       magnesium oxide (MAG-OX) tablet 200 mg  200 mg Oral Daily Willy Eddy, MD       omeprazole disintegrating tablet 20 mg  20 mg Oral Daily Willy Eddy, MD   20 mg at 10/11/23 1007   rosuvastatin (CRESTOR) tablet 10 mg  10 mg Oral QPM Willy Eddy, MD   10 mg at  10/10/23 2014   senna (SENOKOT) tablet 8.6 mg  1 tablet Oral Daily PRN Willy Eddy, MD       Current Outpatient Medications  Medication Sig Dispense Refill   acyclovir (ZOVIRAX) 400 MG tablet TAKE 1 TABLET BY MOUTH TWICE DAILY 60 tablet 0   aspirin EC 81 MG tablet Take 81 mg by mouth daily. Swallow whole.     escitalopram (LEXAPRO) 20 MG tablet Take 1 tablet by mouth daily.     gabapentin (NEURONTIN) 100 MG capsule Take 100 mg by mouth 3 (three) times daily.     ibuprofen (ADVIL) 200 MG tablet Take 200 mg by mouth every 6 (six) hours as needed.     losartan (COZAAR) 100 MG tablet Take 1 tablet by mouth daily.     naproxen sodium (ANAPROX) 220 MG tablet Take 220 mg by mouth daily as needed.     omeprazole (PRILOSEC OTC) 20 MG tablet Take 20 mg by mouth daily.     rosuvastatin (CRESTOR) 10 MG tablet Take 10 mg by mouth every evening.     senna (SENOKOT) 8.6 MG TABS tablet Take 1 tablet (8.6 mg total) by mouth daily as needed for mild constipation. 30 tablet 0   methocarbamol (ROBAXIN) 500 MG tablet Take 1 tablet (500 mg total) by mouth 4 (four) times daily. (Patient not taking: Reported on 10/10/2023) 120 tablet 0   oxyCODONE (ROXICODONE) 5 MG immediate release tablet Take 1 tablet (5 mg total) by mouth every 4 (four) hours as needed for severe pain. (Patient not taking: Reported on 10/10/2023) 30 tablet 0     Discharge Medications: Please see discharge summary for a list of discharge medications.  Relevant Imaging Results:  Relevant Lab Results:   Additional Information SS#:[id number]  [place] Kresha Abelson, LCSW

## 2023-10-11 NOTE — Progress Notes (Signed)
 Inpatient Rehab Admissions Coordinator:   Per therapy recommendations, patient was screened for CIR candidacy by Megan Salon, MS, CCC-SLP. At this time, Pt. does not appear to demonstrate medical necessity to justify in hospital rehabilitation/CIR. She recently completed AIR level rehab following CVA and has not had an acute medical change. I  will not pursue a rehab consult for this Pt.   Recommend other rehab venues to be pursued.  Please contact me with any questions.  Megan Salon, MS, CCC-SLP Rehab Admissions Coordinator  [phone] (celll) [phone] (office)

## 2023-10-12 NOTE — ED Notes (Signed)
 The pt was advised she was up for discharge. The pt advised she had her RV [place] in [address] and would need assistance

## 2023-10-12 NOTE — Progress Notes (Addendum)
 PT Cancellation Note  Patient Details Name: Sarah Donovan MRN: [id number] DOB: 1951-05-19   Cancelled Treatment:    Reason Eval/Treat Not Completed: Other (comment) PT to see pt, with pt inquiring about d/c. Pt reports she has elected to return to RV with HHPT. PT prepared to practice floor transfer when nurse arrived with transport chair to d/c pt. PT did offer for pt to practice floor transfer with pt stating she's already done that (during previous rehab admission).  Aleda Grana, PT, DPT 10/12/23, 1:58 PM    [name] 10/12/2023, 1:28 PM

## 2023-10-12 NOTE — TOC Transition Note (Signed)
 Transition of Care [facility]) - Discharge Note   Patient Details  Name: Sarah Donovan MRN: [id number] Date of Birth: 03-Apr-1951  [facility]) CM/SW Contact:  Colin Broach, LCSW Phone Number: 10/12/2023, 2:51 PM   Clinical Narrative:    Patient to discharge home and will receive home health services from [place] (PT/OT/Aide).  Acceptance from [name] [place].  CSW explained that patient lives in a camper.  [name] informed CSW that patient would be contacted and [name] will get her location information to perform services.  Initially, patient was recommended for CIR, however, she was declined.  Then PT recommended SNF.  Patient had a bed offer at a SNF, but she declined it and stated that she would like home health.  She states that, since she has the RV, she wouldn't be able to get it to a location from [facility] and she isn't comfortable driving herself.  She will work with the [facility] agency.  TOC signing off.   Final next level of care: Acute to Acute Transfer Barriers to Discharge: No Barriers Identified   Patient Goals and CMS Choice     Choice offered to / list presented to : Patient      Discharge Placement                       Discharge Plan and Services Additional resources added to the After Visit Summary for                            HH Arranged: OT, PT, Nurse's Aide [facility] Agency: [facility] Date [facility] Agency Contacted: 10/12/23   Representative spoke with at [facility] Agency: [name]  Social Drivers of Health (SDOH) Interventions SDOH Screenings   Food Insecurity: Patient Unable To Answer (09/05/2023)   Received from [facility]  Housing: Patient Unable To Answer (09/05/2023)   Received from [facility]  Transportation Needs: Patient Unable To Answer (09/05/2023)   Received from [facility]  Utilities: Patient Unable To Answer (09/05/2023)   Received from [facility]  Financial Resource Strain: Medium Risk (04/16/2023)   Received from [facility]  System  Tobacco Use: Medium Risk (10/09/2023)  Health Literacy: Low Risk  (01/23/2021)   Received from [facility], [facility]     Readmission Risk Interventions     No data to display

## 2023-10-12 NOTE — ED Provider Notes (Signed)
-----------------------------------------   4:57 AM on 10/12/2023 -----------------------------------------   Blood pressure (!) 125/57, pulse 66, temperature 98.7 F (37.1 C), temperature source Oral, resp. rate 18, height 5\' 4"  (1.626 m), SpO2 100%.  The patient is calm and cooperative at this time.  There have been no acute events since the last update.  Awaiting disposition plan from case management/social work.    Lorianna Spadaccini, Layla Maw, DO 10/12/23 [phone]

## 2023-10-12 NOTE — ED Notes (Signed)
 Pt able to ambulate with her walker to the restroom with minimal assistance.

## 2023-10-12 NOTE — ED Provider Notes (Signed)
 Home health has been arranged by Fuller Mandril, MD 10/12/23 1245

## 2023-10-12 NOTE — ED Notes (Addendum)
 Assisted pt to bathroom w/ walker.

## 2023-10-12 NOTE — ED Notes (Signed)
 This RN gave report to Marva Panda Paramedic and performed bedside care handoff. Call light in reach, bed wheels locked, side rail raised, pt updated on plan of care. Rounding completed.

## 2023-10-12 NOTE — ED Notes (Signed)
Helped pt ambulate with walker to hall bathroom.

## 2023-10-12 NOTE — ED Notes (Signed)
 The pt is requesting to speak with a Child psychotherapist. The pt is advising she wants to go back and live her in RV or [employer]iece she has. Social worker made aware by ED Nurse Barbee Cough.

## 2023-10-12 NOTE — ED Notes (Signed)
 The pt was advised she was up for discharge. The pt advised her RV was parked in [address] and she would need assistance getting out to her RV. The curtesy [employer] was called to pick up the pt. The pt was advised of the same and asked who was going to help her get her things into the RV. The pt was advised the curtesy [employer] would only be able to assist her to her car. The pt seemed confused out how much assistance she would have after she was discharged. I spoke with both Maralyn Sago and Rod Holler with social work. The pt was advised home health would follow up with her both it may take 24 hours. The pt advised she understood and that she wanted to be discharge. PT had approached the pt and was talking to the pt. The pt advised she understood about home health care and she wanted to be discharged because her insurance wouldn't pay after 3 days. The pt was able to stand pivot with assistance to the wheel chair. The pt and her belongings were wheeled to triage. ED Nurse Marylene Land and ED tech Faith were made aware of the pt being picked up by the curtesy van and if they could assist her.

## 2023-10-12 NOTE — ED Notes (Signed)
 The pt is a 72 yof sitting upright in the bed with her head elevated. The pt is warm, pink, and dry. The pt is alert and oriented to questions. The pt advised she did not need anything and that she was waiting on breakfast. The pt was encouraged to hit the call button or let a staff member know if she needed anything.

## 2024-05-25 ENCOUNTER — Other Ambulatory Visit: Payer: Self-pay | Admitting: Family Medicine

## 2024-05-25 DIAGNOSIS — Z1231 Encounter for screening mammogram for malignant neoplasm of breast: Secondary | ICD-10-CM

## 2024-05-26 ENCOUNTER — Other Ambulatory Visit: Payer: Self-pay | Admitting: Family Medicine

## 2024-05-26 DIAGNOSIS — M542 Cervicalgia: Secondary | ICD-10-CM

## 2024-05-30 ENCOUNTER — Ambulatory Visit
Admission: RE | Admit: 2024-05-30 | Discharge: 2024-05-30 | Disposition: A | Discharge status: Home / Self Care | Source: Ambulatory Visit | Attending: Family Medicine | Admitting: Family Medicine

## 2024-05-30 DIAGNOSIS — M542 Cervicalgia: Secondary | ICD-10-CM | POA: Diagnosis present

## 2024-05-31 ENCOUNTER — Other Ambulatory Visit: Payer: Self-pay | Admitting: Family Medicine

## 2024-05-31 DIAGNOSIS — Z853 Personal history of malignant neoplasm of breast: Secondary | ICD-10-CM
